# Patient Record
Sex: Female | Born: 1945 | Race: White | Hispanic: No | Marital: Married | Smoking: Never smoker
Health system: Southern US, Community
[De-identification: ages and names within clinical notes are randomized; demographics above are authoritative.]

## PROBLEM LIST (undated history)

## (undated) DIAGNOSIS — I10 Essential (primary) hypertension: Secondary | ICD-10-CM

## (undated) HISTORY — PX: BREAST EXCISIONAL BIOPSY: SUR124

---

## 1998-06-23 ENCOUNTER — Other Ambulatory Visit: Admission: RE | Admit: 1998-06-23 | Discharge: 1998-06-23 | Payer: Self-pay | Admitting: Obstetrics and Gynecology

## 1998-07-01 ENCOUNTER — Other Ambulatory Visit: Admission: RE | Admit: 1998-07-01 | Discharge: 1998-07-01 | Payer: Self-pay | Admitting: Radiology

## 1999-07-12 ENCOUNTER — Other Ambulatory Visit: Admission: RE | Admit: 1999-07-12 | Discharge: 1999-07-12 | Payer: Self-pay | Admitting: Obstetrics and Gynecology

## 1999-11-22 ENCOUNTER — Ambulatory Visit (HOSPITAL_COMMUNITY): Admission: RE | Admit: 1999-11-22 | Discharge: 1999-11-22 | Payer: Self-pay | Admitting: Obstetrics and Gynecology

## 1999-11-22 ENCOUNTER — Encounter: Payer: Self-pay | Admitting: Obstetrics and Gynecology

## 2000-08-23 ENCOUNTER — Other Ambulatory Visit: Admission: RE | Admit: 2000-08-23 | Discharge: 2000-08-23 | Payer: Self-pay | Admitting: Unknown Physician Specialty

## 2000-12-20 ENCOUNTER — Ambulatory Visit (HOSPITAL_COMMUNITY): Admission: RE | Admit: 2000-12-20 | Discharge: 2000-12-20 | Payer: Self-pay | Admitting: Obstetrics and Gynecology

## 2000-12-20 ENCOUNTER — Encounter: Payer: Self-pay | Admitting: Obstetrics and Gynecology

## 2002-07-08 ENCOUNTER — Ambulatory Visit (HOSPITAL_COMMUNITY): Admission: RE | Admit: 2002-07-08 | Discharge: 2002-07-08 | Payer: Self-pay | Admitting: Obstetrics and Gynecology

## 2002-07-08 ENCOUNTER — Encounter: Payer: Self-pay | Admitting: Obstetrics and Gynecology

## 2003-07-14 ENCOUNTER — Ambulatory Visit (HOSPITAL_COMMUNITY): Admission: RE | Admit: 2003-07-14 | Discharge: 2003-07-14 | Payer: Self-pay | Admitting: Obstetrics and Gynecology

## 2003-09-09 ENCOUNTER — Other Ambulatory Visit: Admission: RE | Admit: 2003-09-09 | Discharge: 2003-09-09 | Payer: Self-pay | Admitting: Obstetrics and Gynecology

## 2003-11-26 ENCOUNTER — Encounter: Admission: RE | Admit: 2003-11-26 | Discharge: 2003-11-26 | Payer: Self-pay | Admitting: Family Medicine

## 2004-07-26 ENCOUNTER — Ambulatory Visit (HOSPITAL_COMMUNITY): Admission: RE | Admit: 2004-07-26 | Discharge: 2004-07-26 | Payer: Self-pay | Admitting: Obstetrics and Gynecology

## 2004-09-22 ENCOUNTER — Other Ambulatory Visit: Admission: RE | Admit: 2004-09-22 | Discharge: 2004-09-22 | Payer: Self-pay | Admitting: Obstetrics and Gynecology

## 2005-08-17 ENCOUNTER — Ambulatory Visit (HOSPITAL_COMMUNITY): Admission: RE | Admit: 2005-08-17 | Discharge: 2005-08-17 | Payer: Self-pay | Admitting: Obstetrics and Gynecology

## 2006-02-01 ENCOUNTER — Other Ambulatory Visit: Admission: RE | Admit: 2006-02-01 | Discharge: 2006-02-01 | Payer: Self-pay | Admitting: Obstetrics and Gynecology

## 2006-02-06 ENCOUNTER — Encounter: Admission: RE | Admit: 2006-02-06 | Discharge: 2006-02-06 | Payer: Self-pay | Admitting: Family Medicine

## 2006-04-16 ENCOUNTER — Ambulatory Visit (HOSPITAL_COMMUNITY): Admission: RE | Admit: 2006-04-16 | Discharge: 2006-04-16 | Payer: Self-pay | Admitting: Obstetrics and Gynecology

## 2006-10-24 ENCOUNTER — Ambulatory Visit (HOSPITAL_COMMUNITY): Admission: RE | Admit: 2006-10-24 | Discharge: 2006-10-24 | Payer: Self-pay | Admitting: Obstetrics and Gynecology

## 2007-07-04 ENCOUNTER — Other Ambulatory Visit: Admission: RE | Admit: 2007-07-04 | Discharge: 2007-07-04 | Payer: Self-pay | Admitting: Obstetrics and Gynecology

## 2007-11-21 ENCOUNTER — Ambulatory Visit (HOSPITAL_COMMUNITY): Admission: RE | Admit: 2007-11-21 | Discharge: 2007-11-21 | Payer: Self-pay | Admitting: Obstetrics and Gynecology

## 2008-07-06 ENCOUNTER — Other Ambulatory Visit: Admission: RE | Admit: 2008-07-06 | Discharge: 2008-07-06 | Payer: Self-pay | Admitting: Obstetrics and Gynecology

## 2008-12-15 ENCOUNTER — Ambulatory Visit (HOSPITAL_COMMUNITY): Admission: RE | Admit: 2008-12-15 | Discharge: 2008-12-15 | Payer: Self-pay | Admitting: Obstetrics and Gynecology

## 2009-07-07 ENCOUNTER — Other Ambulatory Visit: Admission: RE | Admit: 2009-07-07 | Discharge: 2009-07-07 | Payer: Self-pay | Admitting: Obstetrics and Gynecology

## 2010-03-03 ENCOUNTER — Ambulatory Visit (HOSPITAL_COMMUNITY): Admission: RE | Admit: 2010-03-03 | Discharge: 2010-03-03 | Payer: Self-pay | Admitting: Obstetrics and Gynecology

## 2010-08-16 ENCOUNTER — Other Ambulatory Visit: Payer: Self-pay | Admitting: Obstetrics and Gynecology

## 2010-08-16 ENCOUNTER — Other Ambulatory Visit (HOSPITAL_COMMUNITY)
Admission: RE | Admit: 2010-08-16 | Discharge: 2010-08-16 | Disposition: A | Discharge status: Home / Self Care | Payer: BC Managed Care – PPO | Source: Ambulatory Visit | Attending: Obstetrics and Gynecology | Admitting: Obstetrics and Gynecology

## 2010-08-16 DIAGNOSIS — Z01419 Encounter for gynecological examination (general) (routine) without abnormal findings: Secondary | ICD-10-CM | POA: Insufficient documentation

## 2011-03-10 ENCOUNTER — Other Ambulatory Visit: Payer: Self-pay | Admitting: Family Medicine

## 2011-03-17 ENCOUNTER — Ambulatory Visit
Admission: RE | Admit: 2011-03-17 | Discharge: 2011-03-17 | Disposition: A | Discharge status: Home / Self Care | Payer: BC Managed Care – PPO | Source: Ambulatory Visit | Attending: Family Medicine | Admitting: Family Medicine

## 2011-05-17 ENCOUNTER — Other Ambulatory Visit (HOSPITAL_COMMUNITY): Payer: Self-pay | Admitting: Obstetrics and Gynecology

## 2011-05-17 DIAGNOSIS — Z1231 Encounter for screening mammogram for malignant neoplasm of breast: Secondary | ICD-10-CM

## 2011-06-14 ENCOUNTER — Ambulatory Visit (HOSPITAL_COMMUNITY)
Admission: RE | Admit: 2011-06-14 | Discharge: 2011-06-14 | Disposition: A | Discharge status: Home / Self Care | Payer: BC Managed Care – PPO | Source: Ambulatory Visit | Attending: Obstetrics and Gynecology | Admitting: Obstetrics and Gynecology

## 2011-06-14 DIAGNOSIS — Z1231 Encounter for screening mammogram for malignant neoplasm of breast: Secondary | ICD-10-CM | POA: Insufficient documentation

## 2011-08-21 ENCOUNTER — Other Ambulatory Visit (HOSPITAL_COMMUNITY)
Admission: RE | Admit: 2011-08-21 | Discharge: 2011-08-21 | Disposition: A | Discharge status: Home / Self Care | Payer: BC Managed Care – PPO | Source: Ambulatory Visit | Attending: Obstetrics and Gynecology | Admitting: Obstetrics and Gynecology

## 2011-08-21 ENCOUNTER — Other Ambulatory Visit: Payer: Self-pay | Admitting: Obstetrics and Gynecology

## 2011-08-21 DIAGNOSIS — Z01419 Encounter for gynecological examination (general) (routine) without abnormal findings: Secondary | ICD-10-CM | POA: Insufficient documentation

## 2012-08-20 ENCOUNTER — Other Ambulatory Visit (HOSPITAL_COMMUNITY)
Admission: RE | Admit: 2012-08-20 | Discharge: 2012-08-20 | Disposition: A | Discharge status: Home / Self Care | Payer: Medicare Other | Source: Ambulatory Visit | Attending: Obstetrics and Gynecology | Admitting: Obstetrics and Gynecology

## 2012-08-20 ENCOUNTER — Other Ambulatory Visit: Payer: Self-pay | Admitting: Obstetrics and Gynecology

## 2012-08-20 DIAGNOSIS — Z1151 Encounter for screening for human papillomavirus (HPV): Secondary | ICD-10-CM | POA: Insufficient documentation

## 2012-08-20 DIAGNOSIS — Z01419 Encounter for gynecological examination (general) (routine) without abnormal findings: Secondary | ICD-10-CM | POA: Insufficient documentation

## 2012-10-22 ENCOUNTER — Other Ambulatory Visit (HOSPITAL_COMMUNITY): Payer: Self-pay | Admitting: Family Medicine

## 2012-10-22 DIAGNOSIS — Z1231 Encounter for screening mammogram for malignant neoplasm of breast: Secondary | ICD-10-CM

## 2012-10-24 ENCOUNTER — Ambulatory Visit (HOSPITAL_COMMUNITY)
Admission: RE | Admit: 2012-10-24 | Discharge: 2012-10-24 | Disposition: A | Discharge status: Home / Self Care | Payer: Medicare Other | Source: Ambulatory Visit | Attending: Family Medicine | Admitting: Family Medicine

## 2012-10-24 DIAGNOSIS — Z1231 Encounter for screening mammogram for malignant neoplasm of breast: Secondary | ICD-10-CM

## 2013-08-27 ENCOUNTER — Other Ambulatory Visit: Payer: Self-pay | Admitting: Obstetrics and Gynecology

## 2013-08-27 ENCOUNTER — Other Ambulatory Visit (HOSPITAL_COMMUNITY)
Admission: RE | Admit: 2013-08-27 | Discharge: 2013-08-27 | Disposition: A | Discharge status: Home / Self Care | Payer: Medicare Other | Source: Ambulatory Visit | Attending: Obstetrics and Gynecology | Admitting: Obstetrics and Gynecology

## 2013-08-27 DIAGNOSIS — Z124 Encounter for screening for malignant neoplasm of cervix: Secondary | ICD-10-CM | POA: Insufficient documentation

## 2013-11-24 ENCOUNTER — Other Ambulatory Visit (HOSPITAL_COMMUNITY): Payer: Self-pay | Admitting: Obstetrics and Gynecology

## 2013-11-24 DIAGNOSIS — Z1231 Encounter for screening mammogram for malignant neoplasm of breast: Secondary | ICD-10-CM

## 2013-12-09 ENCOUNTER — Ambulatory Visit (HOSPITAL_COMMUNITY)
Admission: RE | Admit: 2013-12-09 | Discharge: 2013-12-09 | Disposition: A | Discharge status: Home / Self Care | Payer: Medicare Other | Source: Ambulatory Visit | Attending: Obstetrics and Gynecology | Admitting: Obstetrics and Gynecology

## 2013-12-09 DIAGNOSIS — Z1231 Encounter for screening mammogram for malignant neoplasm of breast: Secondary | ICD-10-CM | POA: Diagnosis not present

## 2014-10-08 ENCOUNTER — Encounter: Payer: Self-pay | Admitting: Family Medicine

## 2014-10-08 ENCOUNTER — Ambulatory Visit (INDEPENDENT_AMBULATORY_CARE_PROVIDER_SITE_OTHER)
Admission: RE | Admit: 2014-10-08 | Discharge: 2014-10-08 | Disposition: A | Discharge status: Home / Self Care | Payer: Medicare Other | Source: Ambulatory Visit | Attending: Family Medicine | Admitting: Family Medicine

## 2014-10-08 ENCOUNTER — Ambulatory Visit (INDEPENDENT_AMBULATORY_CARE_PROVIDER_SITE_OTHER): Payer: Medicare Other | Admitting: Family Medicine

## 2014-10-08 ENCOUNTER — Other Ambulatory Visit (INDEPENDENT_AMBULATORY_CARE_PROVIDER_SITE_OTHER): Payer: Medicare Other

## 2014-10-08 VITALS — BP 118/70 | HR 64 | Wt 127.0 lb

## 2014-10-08 DIAGNOSIS — M25531 Pain in right wrist: Secondary | ICD-10-CM | POA: Diagnosis not present

## 2014-10-08 DIAGNOSIS — M654 Radial styloid tenosynovitis [de Quervain]: Secondary | ICD-10-CM | POA: Diagnosis not present

## 2014-10-08 NOTE — Assessment & Plan Note (Signed)
Patient was given an injection today. Patient will do immobilization for the next week. We discussed icing and patient given a topical anti-inflammatory. Patient will come back and see me again in 3 weeks for further evaluation and treatment. Topical anti-inflammatory's given. X-rays ordered today as well to rule out any bony abnormality.

## 2014-10-08 NOTE — Progress Notes (Signed)
Pre visit review using our clinic review tool, if applicable. No additional management support is needed unless otherwise documented below in the visit note. 

## 2014-10-08 NOTE — Progress Notes (Signed)
Tawana ScaleZach Smith D.O. [facility] [address] [place], [place] [address] Phone: [phone] Subjective:    CC: Wrist pain  QMV:HQIONGEXBMHPI:Subjective Natasha Hoover is a 69 y.o. female coming in with complaint of wrist pain right wrist pain. Patient has had this pain since September of last year. Patient states that she has been in a brace as well as been given meloxicam 3 different times with only very minimal benefit. Patient states that it seems to be getting worse over the course of time. Patient is not on a physical therapy. Denies any numbness or tingling. Patient states that the pain is about it can even wake her up at night. Stopping her from daily activities such as opening a can. Seems to be localized over the right thumb. Sometimes she can notice some swelling. Rates the severity of pain is 8 out of 10 when it occurs.  No past medical history on file. No past surgical history on file. History  Substance Use Topics  . Smoking status: Never Smoker   . Smokeless tobacco: Not on file  . Alcohol Use: Not on file   Not on File No family history on file.      Past medical history, social, surgical and family history all reviewed in electronic medical record.   Review of Systems: No headache, visual changes, nausea, vomiting, diarrhea, constipation, dizziness, abdominal pain, skin rash, fevers, chills, night sweats, weight loss, swollen lymph nodes, body aches, joint swelling, muscle aches, chest pain, shortness of breath, mood changes.   Objective Blood pressure 118/70, pulse 64, weight 127 lb (57.607 kg), SpO2 99 %.  General: No apparent distress alert and oriented x3 mood and affect normal, dressed appropriately.  HEENT: Pupils equal, extraocular movements intact  Respiratory: Patient's speak in full sentences and does not appear short of breath  Cardiovascular: No lower extremity edema, non tender, no erythema  Skin: Warm dry intact with no signs of infection or rash on  extremities or on axial skeleton.  Abdomen: Soft nontender  Neuro: Cranial nerves II through XII are intact, neurovascularly intact in all extremities with 2+ DTRs and 2+ pulses.  Lymph: No lymphadenopathy of posterior or anterior cervical chain or axillae bilaterally.  Gait normal with good balance and coordination.  MSK:  Non tender with full range of motion and good stability and symmetric strength and tone of shoulders, elbows, hip, knee and ankles bilaterally.  Wrist: Right Inspection reveals patient does have some swelling on the thenar side of the wrist. ROM smooth and normal with good flexion and extension and ulnar/radial deviation that is symmetrical with opposite wrist. Tender to palpation over the [facility], scaphoid, as well as abductor pollicis longus tendon. No snuffbox tenderness. No tenderness over Canal of Guyon. Mild weakness with abduction of the thumb Positive Finkelstein Negative Watson's test.   MSK US performed of: Right wrist This study was ordered, performed, and interpreted by Terrilee FilesZach Smith D.O.  Wrist: Significant swelling of the abductor pollicis longus tendon sheath with possible tear noted with increasing neovascularization. No effusion seen. TFCC intact. Scapholunate ligament intact. Carpal tunnel visualized and median nerve area normal, flexor tendons all normal in appearance without fraying, tears, or sheath effusions. Power doppler signal normal.  IMPRESSION:  De Quervain's tenosynovitis  Procedure: Real-time Ultrasound Guided Injection of right abductor pollicis longus tendon sheath Device: GE Logiq E  Ultrasound guided injection is preferred based studies that show increased duration, increased effect, greater accuracy, decreased procedural pain, increased response rate, and decreased cost  with ultrasound guided versus blind injection.  Verbal informed consent obtained.  Time-out conducted.  Noted no overlying erythema, induration, or other signs of  local infection.  Skin prepped in a sterile fashion.  Local anesthesia: Topical Ethyl chloride.  With sterile technique and under real time ultrasound guidance:  With a 25-gauge 1 inch needle patient was injected with 0.5 mL of 0.5% Marcaine and 0.5 mL of Kenalog 40 mg/dL. Completed without difficulty  Pain immediately resolved suggesting accurate placement of the medication.  Advised to call if fevers/chills, erythema, induration, drainage, or persistent bleeding.  Images permanently stored and available for review in the ultrasound unit.  Impression: Technically successful ultrasound guided injection.  Procedure note 97110; 15 minutes spent for Therapeutic exercises as stated in above notes.  This included exercises focusing on stretching, strengthening, with significant focus on eccentric aspects. Patient given flexion and extension exercises. Patient given strengthening exercises and also range of motion exercises of the CMC joints and thumb.  Proper technique shown and discussed handout in great detail with ATC.  All questions were discussed and answered.     Impression and Recommendations:     This case required medical decision making of moderate complexity.

## 2014-10-08 NOTE — Patient Instructions (Addendum)
Good to see you.  Ice 20 minutes 2 times daily. Usually after activity and before bed. Exercises 3 times a week.  pennsaid pinkie amount topically 2 times daily as needed.  Wear brace day and night for 1 weeks then nightly for 2 weeks. See me again in 3 weeks.

## 2014-10-28 ENCOUNTER — Ambulatory Visit: Payer: Medicare Other | Admitting: Family Medicine

## 2015-02-08 ENCOUNTER — Other Ambulatory Visit: Payer: Self-pay

## 2015-02-08 DIAGNOSIS — Z1231 Encounter for screening mammogram for malignant neoplasm of breast: Secondary | ICD-10-CM

## 2015-02-25 ENCOUNTER — Ambulatory Visit
Admission: RE | Admit: 2015-02-25 | Discharge: 2015-02-25 | Disposition: A | Discharge status: Home / Self Care | Payer: Medicare Other | Source: Ambulatory Visit

## 2015-02-25 DIAGNOSIS — Z1231 Encounter for screening mammogram for malignant neoplasm of breast: Secondary | ICD-10-CM

## 2015-10-13 ENCOUNTER — Encounter: Payer: Self-pay | Admitting: Family Medicine

## 2015-10-13 ENCOUNTER — Other Ambulatory Visit: Payer: Self-pay

## 2015-10-13 ENCOUNTER — Ambulatory Visit (INDEPENDENT_AMBULATORY_CARE_PROVIDER_SITE_OTHER): Payer: Medicare Other | Admitting: Family Medicine

## 2015-10-13 VITALS — BP 120/80 | HR 67 | Wt 129.0 lb

## 2015-10-13 DIAGNOSIS — M25531 Pain in right wrist: Secondary | ICD-10-CM

## 2015-10-13 DIAGNOSIS — M5030 Other cervical disc degeneration, unspecified cervical region: Secondary | ICD-10-CM

## 2015-10-13 DIAGNOSIS — M654 Radial styloid tenosynovitis [de Quervain]: Secondary | ICD-10-CM

## 2015-10-13 MED ORDER — GABAPENTIN 100 MG PO CAPS: 100.0000 mg | ORAL_CAPSULE | Freq: Every day | ORAL | Status: DC

## 2015-10-13 NOTE — Progress Notes (Signed)
Pre visit review using our clinic review tool, if applicable. No additional management support is needed unless otherwise documented below in the visit note. 

## 2015-10-13 NOTE — Progress Notes (Signed)
Natasha Hoover [address] [place], [place] [address] Phone: [phone] Subjective:    CC: Wrist pain f/u New-onset shoulder pain  BJY:NWGNFAOZHY Natasha Hoover is a 70 y.o. female coming in with complaint of wrist pain right wrist pain. Patient was last seen greater than a year ago. Did have an injection for de Quervain's tenosynovitis. Patient was do conservative therapy. Patient states overall she had been doing very well until the last several weeks. Having increasing tenderness again. Very similar to what it was prior to the injections. Patient states that certain things such as opening cans or torsion give her a severe pain. No pain when not moving. Not doing the exercises or bracing. No new injury.  Patient is also complaining of shoulder pain. Describes the pain as dull throbbing aching sensation of the shoulders bilaterally. Seems to be associated with neck pain. States that if she looks down for long amount of time she can feel some mild radiation going down both of her arms. Denies any weakness. States that it can wake her up at night. Has not tried any medications for it. Still able to do daily activities. Full range of motion of the shoulders. Rates the severity of pain when it occurs is 6 out of 10. Still seems to be intermittent.  No past medical history on file. No past surgical history on file. Social History  Substance Use Topics  . Smoking status: Never Smoker   . Smokeless tobacco: None  . Alcohol Use: None   Not on File No family history on file. No family history of rheumatological diseases      Past medical history, social, surgical and family history all reviewed in electronic medical record.   Review of Systems: No headache, visual changes, nausea, vomiting, diarrhea, constipation, dizziness, abdominal pain, skin rash, fevers, chills, night sweats, weight loss, swollen lymph nodes, body aches, joint swelling, muscle aches, chest  pain, shortness of breath, mood changes.   Objective Blood pressure 120/80, pulse 67, weight 129 lb (58.514 kg), SpO2 99 %.  General: No apparent distress alert and oriented x3 mood and affect normal, dressed appropriately.  HEENT: Pupils equal, extraocular movements intact  Respiratory: Patient's speak in full sentences and does not appear short of breath  Cardiovascular: No lower extremity edema, non tender, no erythema  Skin: Warm dry intact with no signs of infection or rash on extremities or on axial skeleton.  Abdomen: Soft nontender  Neuro: Cranial nerves II through XII are intact, neurovascularly intact in all extremities with 2+ DTRs and 2+ pulses.  Lymph: No lymphadenopathy of posterior or anterior cervical chain or axillae bilaterally.  Gait normal with good balance and coordination.  MSK:  Non tender with full range of motion and good stability and symmetric strength and tone of shoulders, elbows, hip, knee and ankles bilaterally.  Neck: Inspection unremarkable. No palpable stepoffs. Negative Spurling's maneuver. Decreased extension by 10 as well as only having 5 his side bending bilaterally with mild limitation in left-sided rotation. Grip strength and sensation normal in bilateral hands Strength good C4 to T1 distribution No sensory change to C4 to T1 Negative Hoffman sign bilaterally Reflexes normal Wrist: Right Inspection reveals patient does have some swelling on the thenar side of the wrist. ROM smooth and normal with good flexion and extension and ulnar/radial deviation that is symmetrical with opposite wrist. Tender to palpation over the [facility], scaphoid, as well as abductor pollicis longus tendon. No snuffbox tenderness. No tenderness over  Canal of [place]. Mild weakness with abduction of the thumb Positive Finkelstein Negative Watson's test. Contralateral wrist unremarkable   MSK US performed of: Right wrist This study was ordered, performed, and interpreted by  Terrilee FilesZach Artemus Romanoff D.O.  Wrist: Significant swelling of the abductor pollicis longus tendon sheath with increasing tear noted from previous exam  effusion of the tendon sheath also noted TFCC intact. Scapholunate ligament intact. Carpal tunnel visualized and median nerve area normal, flexor tendons all normal in appearance without fraying, tears, or sheath effusions. Very mild underlying degenerative changes noted Power doppler signal normal.  IMPRESSION:  De Quervain's tenosynovitis with intersubstance tearing  Procedure: Real-time Ultrasound Guided Injection of right abductor pollicis longus tendon sheath Device: GE Logiq E  Ultrasound guided injection is preferred based studies that show increased duration, increased effect, greater accuracy, decreased procedural pain, increased response rate, and decreased cost with ultrasound guided versus blind injection.  Verbal informed consent obtained.  Time-out conducted.  Noted no overlying erythema, induration, or other signs of local infection.  Skin prepped in a sterile fashion.  Local anesthesia: Topical Ethyl chloride.  With sterile technique and under real time ultrasound guidance:  With a 25-gauge 1 inch needle patient was injected with 0.5 mL of 0.5% Marcaine and 0.5 mL of Kenalog 40 mg/dL. Completed without difficulty  Pain immediately resolved suggesting accurate placement of the medication.  Advised to call if fevers/chills, erythema, induration, drainage, or persistent bleeding.  Images permanently stored and available for review in the ultrasound unit.  Impression: Technically successful ultrasound guided injection.  Procedure note 97110; 15 minutes spent for Therapeutic exercises as stated in above notes.  This included exercises focusing on stretching, strengthening, with significant focus on eccentric aspects. Patient given flexion and extension exercises. Patient given strengthening exercises and also range of motion exercises of the  CMC joints and thumb.  Proper technique shown and discussed handout in great detail with ATC.  All questions were discussed and answered.     Impression and Recommendations:     This case required medical decision making of moderate complexity.

## 2015-10-13 NOTE — Assessment & Plan Note (Signed)
Patient given another injection today. Patient will start wearing the brace on a more regular basis. Does have trial of topical anti-inflammatory's. We discussed icing regimen. Follow-up again with me in 4 weeks.

## 2015-10-13 NOTE — Patient Instructions (Addendum)
Great to see you  Gabapentin 100mg  at night for 3 nights then 200mg  thereafter On wall with heels, butt shoulder and head touching for a goal of 5 minutes daily  Exercises 3 times a week for the upper back .  Wear wrist brace nightly for 2 weeks.  See me again in 4 weeks if neck is not a lot better Have a great time traveling!

## 2015-11-17 ENCOUNTER — Encounter: Payer: Self-pay | Admitting: Family Medicine

## 2015-11-17 ENCOUNTER — Ambulatory Visit (INDEPENDENT_AMBULATORY_CARE_PROVIDER_SITE_OTHER): Payer: Medicare Other | Admitting: Family Medicine

## 2015-11-17 VITALS — BP 122/82 | HR 72 | Wt 130.0 lb

## 2015-11-17 DIAGNOSIS — M654 Radial styloid tenosynovitis [de Quervain]: Secondary | ICD-10-CM

## 2015-11-17 DIAGNOSIS — M7550 Bursitis of unspecified shoulder: Secondary | ICD-10-CM | POA: Insufficient documentation

## 2015-11-17 DIAGNOSIS — M5030 Other cervical disc degeneration, unspecified cervical region: Secondary | ICD-10-CM

## 2015-11-17 DIAGNOSIS — M7551 Bursitis of right shoulder: Secondary | ICD-10-CM

## 2015-11-17 NOTE — Assessment & Plan Note (Signed)
Very mild signs of shoulder bursitis. If continuing to give her any difficulty I would consider a diagnostic as well as hopefully therapeutic injection of the shoulder. Encourage her to continue the home exercises which she has. Encourage her to Concentrate on posture

## 2015-11-17 NOTE — Patient Instructions (Signed)
You are doing well.  Maybe consider the brace at night.  The tendon will likely heal but does look like it will be slow Can repeat injection every 3 months.  The neck looks good but avoid excessive extension.  Continue with the exercises 2-3 times a week For the shoulder ice before bed.   Do not lift anything outside your peripheral vision  If shoulder is not better see me again in 2 months.  Otherwise see me when you need me.

## 2015-11-17 NOTE — Assessment & Plan Note (Signed)
Degenerative disc disease noted. Patient will hold on any x-rays but if continuing have pain I would like to consider this. Patient continue on the low dose gabapentin and we'll see if patient can come off of it in the future.

## 2015-11-17 NOTE — Progress Notes (Signed)
Tawana ScaleZach Smith D.O.  [facility] [address] [place], [place] [address] Phone: [phone] Subjective:    CC: Wrist pain f/u Follow-up neck and shoulder pain  BJY:NWGNFAOZHYHPI:Subjective Natasha EvertsYvonne Hoover is a 70 y.o. female coming in with complaint of wrist pain right wrist pain. Had de Quervain's tenosynovitis as well as an intersubstance tearing of the tendon. Patient has been wearing the brace at night. States that since injection approximate 70% better. States that if she does any heavy lifting she still notices it. Otherwise has noticed improvement..  Patient previously was complaining more shoulder pain likely had more of a cervical degenerative disc. Patient states that the gabapentin has been helpful. States that it is not as severe. Helping her sleep at night but some mild grogginess. Only doing 100 mg. No worsening of the symptoms.  Complains of some mild discomfort of the shoulder itself. Right-sided. Only certain activities. Can wake her up at night. States though that it seems to be few and far between. Not stopping her from daily activities.  No past medical history on file. No past surgical history on file. Social History  Substance Use Topics  . Smoking status: Never Smoker   . Smokeless tobacco: None  . Alcohol Use: None   Not on File No family history on file. No family history of rheumatological diseases      Past medical history, social, surgical and family history all reviewed in electronic medical record.   Review of Systems: No headache, visual changes, nausea, vomiting, diarrhea, constipation, dizziness, abdominal pain, skin rash, fevers, chills, night sweats, weight loss, swollen lymph nodes, body aches, joint swelling, muscle aches, chest pain, shortness of breath, mood changes.   Objective Blood pressure 122/82, pulse 72, weight 130 lb (58.968 kg).  General: No apparent distress alert and oriented x3 mood and affect normal, dressed appropriately.  HEENT:  Pupils equal, extraocular movements intact  Respiratory: Patient's speak in full sentences and does not appear short of breath  Cardiovascular: No lower extremity edema, non tender, no erythema  Skin: Warm dry intact with no signs of infection or rash on extremities or on axial skeleton.  Abdomen: Soft nontender  Neuro: Cranial nerves II through XII are intact, neurovascularly intact in all extremities with 2+ DTRs and 2+ pulses.  Lymph: No lymphadenopathy of posterior or anterior cervical chain or axillae bilaterally.  Gait normal with good balance and coordination.  MSK:  Non tender with full range of motion and good stability and symmetric strength and tone of  elbows, hip, knee and ankles bilaterally.  Neck: Inspection unremarkable. No palpable stepoffs. Negative Spurling's maneuver. Decreased extension by 10 as well as only having 5 his side bending bilaterally with mild limitation in left-sided rotation. Grip strength and sensation normal in bilateral hands Strength good C4 to T1 distribution No sensory change to C4 to T1 Negative Hoffman sign bilaterally Reflexes normal Seems stable. Shoulder: Right Inspection reveals no abnormalities, atrophy or asymmetry. Palpation is normal with no tenderness over AC joint or bicipital groove. ROM is full in all planes. Rotator cuff strength normal throughout. Mild impingement with positive Hawkins Speeds and Yergason's tests normal. No labral pathology noted with negative Obrien's, negative clunk and good stability. Normal scapular function observed. No painful arc and no drop arm sign. No apprehension sign Contralateral shoulder unremarkable  Wrist: Right Inspection reveals patient does have some swelling on the thenar side of the wrist. ROM smooth and normal with good flexion and extension and ulnar/radial deviation that is  symmetrical with opposite wrist. Mild tenderness over the [facility] but improved. No snuffbox tenderness. No  tenderness over Canal of Guyon. Mild weakness with abduction of the thumb still present but improved Finkelstein's which is improved Negative Watson's test. Contralateral wrist unremarkable    Impression and Recommendations:     This case required medical decision making of moderate complexity.

## 2015-11-17 NOTE — Assessment & Plan Note (Signed)
Doing well at this time. We'll continue to monitor closely. Patient can have repeat injections every 3-4 months if necessary. Hopefully that this will now be necessary. Encourage her to continue to wear the brace.

## 2016-02-22 ENCOUNTER — Other Ambulatory Visit: Payer: Self-pay | Admitting: Family Medicine

## 2016-02-23 NOTE — Telephone Encounter (Signed)
Refill done.  

## 2016-03-01 ENCOUNTER — Other Ambulatory Visit: Payer: Self-pay | Admitting: Family Medicine

## 2016-03-01 DIAGNOSIS — Z1231 Encounter for screening mammogram for malignant neoplasm of breast: Secondary | ICD-10-CM

## 2016-03-22 ENCOUNTER — Ambulatory Visit
Admission: RE | Admit: 2016-03-22 | Discharge: 2016-03-22 | Disposition: A | Discharge status: Home / Self Care | Payer: Medicare Other | Source: Ambulatory Visit | Attending: Family Medicine | Admitting: Family Medicine

## 2016-03-22 DIAGNOSIS — Z1231 Encounter for screening mammogram for malignant neoplasm of breast: Secondary | ICD-10-CM

## 2016-08-14 ENCOUNTER — Other Ambulatory Visit (HOSPITAL_COMMUNITY)
Admission: RE | Admit: 2016-08-14 | Discharge: 2016-08-14 | Disposition: A | Discharge status: Home / Self Care | Payer: Medicare Other | Source: Ambulatory Visit | Attending: Obstetrics and Gynecology | Admitting: Obstetrics and Gynecology

## 2016-08-14 ENCOUNTER — Other Ambulatory Visit: Payer: Self-pay | Admitting: Obstetrics and Gynecology

## 2016-08-14 DIAGNOSIS — Z01411 Encounter for gynecological examination (general) (routine) with abnormal findings: Secondary | ICD-10-CM | POA: Diagnosis present

## 2016-08-14 DIAGNOSIS — Z1151 Encounter for screening for human papillomavirus (HPV): Secondary | ICD-10-CM | POA: Insufficient documentation

## 2016-08-17 LAB — CYTOLOGY - PAP: HPV: NOT DETECTED

## 2016-09-27 ENCOUNTER — Other Ambulatory Visit: Payer: Self-pay | Admitting: Family Medicine

## 2016-09-27 ENCOUNTER — Ambulatory Visit (INDEPENDENT_AMBULATORY_CARE_PROVIDER_SITE_OTHER): Payer: Medicare Other | Admitting: Family Medicine

## 2016-09-27 ENCOUNTER — Ambulatory Visit: Payer: Self-pay

## 2016-09-27 ENCOUNTER — Encounter: Payer: Self-pay | Admitting: Family Medicine

## 2016-09-27 VITALS — BP 130/82 | HR 68 | Ht 66.0 in | Wt 126.0 lb

## 2016-09-27 DIAGNOSIS — M654 Radial styloid tenosynovitis [de Quervain]: Secondary | ICD-10-CM | POA: Diagnosis not present

## 2016-09-27 NOTE — Progress Notes (Signed)
Tawana ScaleZach Smith D.O. [facility] [address] [place], [place] [address] Phone: [phone] Subjective:      CC: Right wrist pain  HQI:ONGEXBMWUXHPI:Subjective  Natasha EvertsYvonne Hoover is a 71 y.o. female coming in with complaint of right wrist pain. Patient was previously seen one year ago and found to have more of a de Quervain's tenosynovitis. Elected to have injection. Patient was to do home exercises and bracing. Patient states Since then she was doing better. Patient states though now sent have worsening pain again. Patient didn't help her granddaughter move out of the dorm. Sentences and has had more pain. Has been trying to wear the brace with no significant benefit. Waking her up at night and affecting daily activities.     No past medical history on file. No past surgical history on file. Social History   Social History  . Marital status: Married    Spouse name: N/A  . Number of children: N/A  . Years of education: N/A   Social History Main Topics  . Smoking status: Never Smoker  . Smokeless tobacco: Never Used  . Alcohol use None  . Drug use: Unknown  . Sexual activity: Not Asked   Other Topics Concern  . None   Social History Narrative  . None   Not on File No family history on file.  Past medical history, social, surgical and family history all reviewed in electronic medical record.  No pertanent information unless stated regarding to the chief complaint.   Review of Systems:Review of systems updated and as accurate as of 09/27/16  No headache, visual changes, nausea, vomiting, diarrhea, constipation, dizziness, abdominal pain, skin rash, fevers, chills, night sweats, weight loss, swollen lymph nodes, body aches, joint swelling, muscle aches, chest pain, shortness of breath, mood changes.   Objective  Blood pressure 130/82, pulse 68, height 5\' 6"  (1.676 m), weight 126 lb (57.2 kg), SpO2 98 %.  Systems examined below as of 09/27/16 General: NAD A&O x3 mood, affect normal   HEENT: Pupils equal, extraocular movements intact no nystagmus Respiratory: not short of breath at rest or with speaking Cardiovascular: No lower extremity edema, non tender Skin: Warm dry intact with no signs of infection or rash on extremities or on axial skeleton. Abdomen: Soft nontender, no masses Neuro: Cranial nerves  intact, neurovascularly intact in all extremities with 2+ DTRs and 2+ pulses. Lymph: No lymphadenopathy appreciated today  Gait normal with good balance and coordination.  MSK: Non tender with full range of motion and good stability and symmetric strength and tone of shoulders, elbows, ,  knee hips and ankles bilaterally.   Wrist: Right Inspection normal with no visible erythema or swelling. ROM smooth and normal with good flexion and extension and ulnar/radial deviation that is symmetrical with opposite wrist. Palpation is normal over metacarpals, navicular, lunate, and TFCC; tendons without tenderness/ swelling No snuffbox tenderness. No tenderness over Canal of Gu[place]. Strength 5/5 in all directions without pain. Positive Finkelstein, negative tinel's and phalens. Negative Watson's test.  Procedure: Real-time Ultrasound Guided Injection of right Abductor pollicis longs tendon sheath Device: GE Logiq E  Ultrasound guided injection is preferred based studies that show increased duration, increased effect, greater accuracy, decreased procedural pain, increased response rate with ultrasound guided versus blind injection.  Verbal informed consent obtained.  Time-out conducted.  Noted no overlying erythema, induration, or other signs of local infection.  Skin prepped in a sterile fashion.  Local anesthesia: Topical Ethyl chloride.  With sterile technique and under  real time ultrasound guidance:  tendon visualized.  23g 5/8 inch needle inserted distal to proximal approach into tendon sheath. Pictures taken  for needle placement. Patient did have injection of 0.5 cc of 0.5%  Marcaine, and 0.5 cc of Kenalog 40 mg/dL. Completed without difficulty  Pain immediately resolved suggesting accurate placement of the medication.  Advised to call if fevers/chills, erythema, induration, drainage, or persistent bleeding.  Images permanently stored and available for review in the ultrasound unit.  Impression: Technically successful ultrasound guided injection.    Impression and Recommendations:     This case required medical decision making of moderate complexity.      Note: This dictation was prepared with Dragon dictation along with smaller phrase technology. Any transcriptional errors that result from this process are unintentional.

## 2016-09-27 NOTE — Assessment & Plan Note (Signed)
Patient was given an injection. Tolerated the procedure well. We discussed icing regimen and home exercises. We discussed which activities in which ones to avoid. Encourage patient to increase activity as tolerated. Short course of the brace. Follow-up again as needed as long as patient does well.

## 2016-09-27 NOTE — Patient Instructions (Signed)
Good to see you  You know the drill.  If I have to do this annually not a big deal.  Start the exercises again 3 times a week  Brace day and night for 1 week then nightly for 2 weeks.  See me again in 4 weeks if not better or otherwise as needed.

## 2017-03-14 ENCOUNTER — Other Ambulatory Visit: Payer: Self-pay | Admitting: Family Medicine

## 2017-03-14 DIAGNOSIS — Z1231 Encounter for screening mammogram for malignant neoplasm of breast: Secondary | ICD-10-CM

## 2017-04-12 ENCOUNTER — Ambulatory Visit
Admission: RE | Admit: 2017-04-12 | Discharge: 2017-04-12 | Disposition: A | Discharge status: Home / Self Care | Payer: Medicare Other | Source: Ambulatory Visit | Attending: Family Medicine | Admitting: Family Medicine

## 2017-04-12 DIAGNOSIS — Z1231 Encounter for screening mammogram for malignant neoplasm of breast: Secondary | ICD-10-CM

## 2017-07-04 NOTE — Progress Notes (Signed)
Tawana ScaleZach Aislinn Feliz D.O. [facility] [address] [place], [place] [address] Phone: [phone] Subjective:     CC: Right wrist pain  BJY:NWGNFAOZHYHPI:Subjective  Raylene EvertsYvonne Hoover is a 72 y.o. female coming in with complaint of right wrist pain. Her pain started back in December and her wrist pain started then. She has been using ice and bracing at night.   Patient states it feels very similar to previous exam.    No past medical history on file. Past Surgical History:  Procedure Laterality Date  . BREAST EXCISIONAL BIOPSY Right    Social History   Socioeconomic History  . Marital status: Married    Spouse name: None  . Number of children: None  . Years of education: None  . Highest education level: None  Social Needs  . Financial resource strain: None  . Food insecurity - worry: None  . Food insecurity - inability: None  . Transportation needs - medical: None  . Transportation needs - non-medical: None  Occupational History  . None  Tobacco Use  . Smoking status: Never Smoker  . Smokeless tobacco: Never Used  Substance and Sexual Activity  . Alcohol use: None  . Drug use: None  . Sexual activity: None  Other Topics Concern  . None  Social History Narrative  . None   Not on File No family history on file.  No family history of autoimmune   Past medical history, social, surgical and family history all reviewed in electronic medical record.  No pertanent information unless stated regarding to the chief complaint.   Review of Systems:Review of systems updated and as accurate as of 07/05/17  No headache, visual changes, nausea, vomiting, diarrhea, constipation, dizziness, abdominal pain, skin rash, fevers, chills, night sweats, weight loss, swollen lymph nodes, body aches, joint swelling, muscle aches, chest pain, shortness of breath, mood changes.   Objective  Blood pressure 120/76, pulse 63, height 5\' 4"  (1.626 m), weight 126 lb (57.2 kg), SpO2 98 %. Systems examined  below as of 07/05/17   General: No apparent distress alert and oriented x3 mood and affect normal, dressed appropriately.  HEENT: Pupils equal, extraocular movements intact  Respiratory: Patient's speak in full sentences and does not appear short of breath  Cardiovascular: No lower extremity edema, non tender, no erythema  Skin: Warm dry intact with no signs of infection or rash on extremities or on axial skeleton.  Abdomen: Soft nontender  Neuro: Cranial nerves II through XII are intact, neurovascularly intact in all extremities with 2+ DTRs and 2+ pulses.  Lymph: No lymphadenopathy of posterior or anterior cervical chain or axillae bilaterally.  Gait normal with good balance and coordination.  MSK:  Non tender with full range of motion and good stability and symmetric strength and tone of shoulders, elbows,  hip, knee and ankles bilaterally.  Wrist: Right Inspection normal with no visible erythema or swelling. ROM smooth and normal with good flexion and extension and ulnar/radial deviation that is symmetrical with opposite wrist. Palpation is normal over metacarpals, navicular, lunate, and TFCC; tendons without tenderness/ swelling No snuffbox tenderness. No tenderness over Canal of Guyon. Strength 5/5 in all directions without pain. Positive Finkelstein, negative Tinel's and phalens. Negative Watson's test. Contralateral wrist unremarkable  Procedure: Real-time Ultrasound Guided Injection of right Abductor pollicis longs tendon sheath Device: GE Logiq E  Ultrasound guided injection is preferred based studies that show increased duration, increased effect, greater accuracy, decreased procedural pain, increased response rate with ultrasound guided versus blind  injection.  Verbal informed consent obtained.  Time-out conducted.  Noted no overlying erythema, induration, or other signs of local infection.  Skin prepped in a sterile fashion.  Local anesthesia: Topical Ethyl chloride.  With  sterile technique and under real time ultrasound guidance:  tendon visualized.  23g 5/8 inch needle inserted distal to proximal approach into tendon sheath. Pictures taken  for needle placement. Patient did have injection of 0.5 cc of 0.5% Marcaine, and 0.5 cc of Kenalog 40 mg/dL. Completed without difficulty  Pain immediately resolved suggesting accurate placement of the medication.  Advised to call if fevers/chills, erythema, induration, drainage, or persistent bleeding.  Images permanently stored and available for review in the ultrasound unit.  Impression: Technically successful ultrasound guided injection.   Impression and Recommendations:     This case required medical decision making of moderate complexity.      Note: This dictation was prepared with Dragon dictation along with smaller phrase technology. Any transcriptional errors that result from this process are unintentional.

## 2017-07-05 ENCOUNTER — Ambulatory Visit: Payer: Medicare Other | Admitting: Family Medicine

## 2017-07-05 ENCOUNTER — Ambulatory Visit: Payer: Self-pay

## 2017-07-05 ENCOUNTER — Encounter: Payer: Self-pay | Admitting: Family Medicine

## 2017-07-05 VITALS — BP 120/76 | HR 63 | Ht 64.0 in | Wt 126.0 lb

## 2017-07-05 DIAGNOSIS — M25531 Pain in right wrist: Secondary | ICD-10-CM

## 2017-07-05 DIAGNOSIS — M654 Radial styloid tenosynovitis [de Quervain]: Secondary | ICD-10-CM

## 2017-07-05 NOTE — Patient Instructions (Signed)
Good to see you  Ice is your friend Brace day and night for 1 week pennsaid pinkie amount topically 2 times daily as needed.   Brace maybe nightly for 2 weeks.  Exercises 3 times a week.  See me again in 6 weeks if not gone.

## 2017-07-05 NOTE — Assessment & Plan Note (Signed)
Repeat injection given today.  Tolerated the procedure well.  Discussed icing regimen and home exercises as well as bracing.  Topical anti-inflammatories given.  Follow-up again in 4-6 weeks

## 2018-01-28 NOTE — Progress Notes (Signed)
[name] [address] [place], [place] [address] Phone: [phone] Subjective:   Bruce Donath, am serving as a scribe for Dr. Antoine Primas.    CC: right wrist pain   BJY:NWGNFAOZHY  Natasha Hoover is a 72 y.o. female coming in with complaint of right wrist pain. She has been having right wrist pain that seems to be increasing over the past month. Pain is alleviated with wearing wrist brace. Feels like she does not have any strength. Is having an increase in thickness of the pollicus tendons.       History reviewed. No pertinent past medical history. Past Surgical History:  Procedure Laterality Date  . BREAST EXCISIONAL BIOPSY Right    Social History   Socioeconomic History  . Marital status: Married    Spouse name: Not on file  . Number of children: Not on file  . Years of education: Not on file  . Highest education level: Not on file  Occupational History  . Not on file  Social Needs  . Financial resource strain: Not on file  . Food insecurity:    Worry: Not on file    Inability: Not on file  . Transportation needs:    Medical: Not on file    Non-medical: Not on file  Tobacco Use  . Smoking status: Never Smoker  . Smokeless tobacco: Never Used  Substance and Sexual Activity  . Alcohol use: Not on file  . Drug use: Not on file  . Sexual activity: Not on file  Lifestyle  . Physical activity:    Days per week: Not on file    Minutes per session: Not on file  . Stress: Not on file  Relationships  . Social connections:    Talks on phone: Not on file    Gets together: Not on file    Attends religious service: Not on file    Active member of club or organization: Not on file    Attends meetings of clubs or organizations: Not on file    Relationship status: Not on file  Other Topics Concern  . Not on file  Social History Narrative  . Not on file   Not on File History reviewed. No pertinent family history.  No family  history of autoimmune   Current Outpatient Medications (Cardiovascular):  .  lisinopril (PRINIVIL,ZESTRIL) 5 MG tablet,       Past medical history, social, surgical and family history all reviewed in electronic medical record.  No pertanent information unless stated regarding to the chief complaint.   Review of Systems:  No headache, visual changes, nausea, vomiting, diarrhea, constipation, dizziness, abdominal pain, skin rash, fevers, chills, night sweats, weight loss, swollen lymph nodes, body aches, joint swelling, muscle aches, chest pain, shortness of breath, mood changes.   Objective  Blood pressure 112/82, pulse 73, height 5\' 4"  (1.626 m), weight 124 lb (56.2 kg), SpO2 96 %.   General: No apparent distress alert and oriented x3 mood and affect normal, dressed appropriately.  HEENT: Pupils equal, extraocular movements intact  Respiratory: Patient's speak in full sentences and does not appear short of breath  Cardiovascular: No lower extremity edema, non tender, no erythema  Skin: Warm dry intact with no signs of infection or rash on extremities or on axial skeleton.  Abdomen: Soft nontender  Neuro: Cranial nerves II through XII are intact, neurovascularly intact in all extremities with 2+ DTRs and 2+ pulses.  Lymph: No lymphadenopathy of posterior or  anterior cervical chain or axillae bilaterally.  Gait normal with good balance and coordination.  MSK:  Non tender with full range of motion and good stability and symmetric strength and tone of shoulders, elbows,  hip, knee and ankles bilaterally.  Wrist: right  Inspection normal with no visible erythema or swelling. ROM smooth and normal with good flexion and extension and ulnar/radial deviation that is symmetrical with opposite wrist. Palpation is normal over metacarpals, navicular, lunate, and TFCC; tendons without tenderness/ swelling No snuffbox tenderness. No tenderness over Canal of Guyon. Strength 5/5 in all directions  without pain. Positive Finkelstein, negative tinel's and phalens. Negative Watson's test. Contralateral wrist unremarkable  Procedure: Real-time Ultrasound Guided Injection of right Abductor pollicis longs tendon sheath Device: GE Logiq E  Ultrasound guided injection is preferred based studies that show increased duration, increased effect, greater accuracy, decreased procedural pain, increased response rate with ultrasound guided versus blind injection.  Verbal informed consent obtained.  Time-out conducted.  Noted no overlying erythema, induration, or other signs of local infection.  Skin prepped in a sterile fashion.  Local anesthesia: Topical Ethyl chloride.  With sterile technique and under real time ultrasound guidance:  tendon visualized.  23g 5/8 inch needle inserted distal to proximal approach into tendon sheath. Pictures taken  for needle placement. Patient did have injection of 0.5 cc of 0.5% Marcaine, and 0.5 cc of Kenalog 40 mg/dL. Completed without difficulty  Pain immediately resolved suggesting accurate placement of the medication.  Advised to call if fevers/chills, erythema, induration, drainage, or persistent bleeding.  Images permanently stored and available for review in the ultrasound unit.  Impression: Technically successful ultrasound guided injection.    Impression and Recommendations:     This case required medical decision making of moderate complexity. The above documentation has been reviewed and is accurate and complete Judi SaaZachary M Smith, DO       Note: This dictation was prepared with Dragon dictation along with smaller phrase technology. Any transcriptional errors that result from this process are unintentional.

## 2018-01-29 ENCOUNTER — Ambulatory Visit: Payer: Self-pay

## 2018-01-29 ENCOUNTER — Encounter: Payer: Self-pay | Admitting: Family Medicine

## 2018-01-29 ENCOUNTER — Ambulatory Visit: Payer: Medicare Other | Admitting: Family Medicine

## 2018-01-29 VITALS — BP 112/82 | HR 73 | Ht 64.0 in | Wt 124.0 lb

## 2018-01-29 DIAGNOSIS — M654 Radial styloid tenosynovitis [de Quervain]: Secondary | ICD-10-CM | POA: Diagnosis not present

## 2018-01-29 DIAGNOSIS — M25531 Pain in right wrist: Secondary | ICD-10-CM

## 2018-01-29 NOTE — Assessment & Plan Note (Signed)
Given an injection today.  We discussed the possibility of PRP.  Discussed icing stretching for exercise. Discussed bracing.  Worsening symptoms we will consider other possible treatment.  Follow-up again for 8 weeks

## 2018-01-29 NOTE — Patient Instructions (Signed)
Good to see you  Ice is yoru friend Wear brace day and night or week  pennsaid pinkie amount topically 2 times daily as needed.  Exercises 3 times a week.  In a week  See me when you need me but read about PRP if it does not last 3 months

## 2018-04-12 ENCOUNTER — Other Ambulatory Visit: Payer: Self-pay | Admitting: Family Medicine

## 2018-04-12 DIAGNOSIS — Z1231 Encounter for screening mammogram for malignant neoplasm of breast: Secondary | ICD-10-CM

## 2018-05-22 ENCOUNTER — Ambulatory Visit: Payer: Medicare Other

## 2018-06-25 ENCOUNTER — Ambulatory Visit
Admission: RE | Admit: 2018-06-25 | Discharge: 2018-06-25 | Disposition: A | Discharge status: Home / Self Care | Payer: Medicare Other | Source: Ambulatory Visit | Attending: Family Medicine | Admitting: Family Medicine

## 2018-06-25 DIAGNOSIS — Z1231 Encounter for screening mammogram for malignant neoplasm of breast: Secondary | ICD-10-CM

## 2019-07-07 ENCOUNTER — Ambulatory Visit: Payer: Medicare PPO | Attending: Internal Medicine

## 2019-07-07 DIAGNOSIS — Z23 Encounter for immunization: Secondary | ICD-10-CM | POA: Insufficient documentation

## 2019-07-07 NOTE — Progress Notes (Signed)
   Covid-19 Vaccination Clinic  Name:  Natasha Hoover    MRN: [id number] DOB: 22-Jun-1945  07/07/2019  Natasha Hoover was observed post Covid-19 immunization for 15 minutes without incidence. She was provided with Vaccine Information Sheet and instruction to access the V-Safe system.   Natasha Hoover was instructed to call 911 with any severe reactions post vaccine: Marland Kitchen Difficulty breathing  . Swelling of your face and throat  . A fast heartbeat  . A bad rash all over your body  . Dizziness and weakness    Immunizations Administered    Name Date Dose VIS Date Route   Pfizer COVID-19 Vaccine 07/07/2019 11:36 AM 0.3 mL 04/18/2019 Intramuscular   Manufacturer: [employer], [employer]   Lot: [id number]   NDC: [address]-6

## 2019-07-29 ENCOUNTER — Other Ambulatory Visit: Payer: Self-pay | Admitting: Family Medicine

## 2019-07-29 DIAGNOSIS — Z1231 Encounter for screening mammogram for malignant neoplasm of breast: Secondary | ICD-10-CM

## 2019-08-05 ENCOUNTER — Ambulatory Visit: Payer: Medicare PPO | Attending: Internal Medicine

## 2019-08-05 DIAGNOSIS — Z23 Encounter for immunization: Secondary | ICD-10-CM

## 2019-08-05 NOTE — Progress Notes (Signed)
   Covid-19 Vaccination Clinic  Name:  Natasha Hoover    MRN: [id number] DOB: 11-Apr-1946  08/05/2019  [name] was observed post Covid-19 immunization for 15 minutes without incident. She was provided with Vaccine Information Sheet and instruction to access the V-Safe system.   [name] was instructed to call 911 with any severe reactions post vaccine: Marland Kitchen Difficulty breathing  . Swelling of face and throat  . A fast heartbeat  . A bad rash all over body  . Dizziness and weakness   Immunizations Administered    Name Date Dose VIS Date Route   Pfizer COVID-19 Vaccine 08/05/2019  3:31 PM 0.3 mL 04/18/2019 Intramuscular   Manufacturer: [employer], [employer]   Lot: [id number]   NDC: [address]-3

## 2019-09-17 ENCOUNTER — Other Ambulatory Visit: Payer: Self-pay

## 2019-09-17 ENCOUNTER — Ambulatory Visit
Admission: RE | Admit: 2019-09-17 | Discharge: 2019-09-17 | Disposition: A | Discharge status: Home / Self Care | Payer: Medicare PPO | Source: Ambulatory Visit | Attending: Family Medicine | Admitting: Family Medicine

## 2019-09-17 DIAGNOSIS — Z1231 Encounter for screening mammogram for malignant neoplasm of breast: Secondary | ICD-10-CM | POA: Diagnosis not present

## 2019-10-29 DIAGNOSIS — L821 Other seborrheic keratosis: Secondary | ICD-10-CM | POA: Diagnosis not present

## 2019-10-29 DIAGNOSIS — D2271 Melanocytic nevi of right lower limb, including hip: Secondary | ICD-10-CM | POA: Diagnosis not present

## 2019-10-29 DIAGNOSIS — D2371 Other benign neoplasm of skin of right lower limb, including hip: Secondary | ICD-10-CM | POA: Diagnosis not present

## 2019-10-29 DIAGNOSIS — D1801 Hemangioma of skin and subcutaneous tissue: Secondary | ICD-10-CM | POA: Diagnosis not present

## 2019-10-29 DIAGNOSIS — I788 Other diseases of capillaries: Secondary | ICD-10-CM | POA: Diagnosis not present

## 2019-10-29 DIAGNOSIS — D225 Melanocytic nevi of trunk: Secondary | ICD-10-CM | POA: Diagnosis not present

## 2019-10-29 DIAGNOSIS — L738 Other specified follicular disorders: Secondary | ICD-10-CM | POA: Diagnosis not present

## 2019-11-19 DIAGNOSIS — E559 Vitamin D deficiency, unspecified: Secondary | ICD-10-CM | POA: Diagnosis not present

## 2019-11-19 DIAGNOSIS — R7303 Prediabetes: Secondary | ICD-10-CM | POA: Diagnosis not present

## 2019-11-19 DIAGNOSIS — R634 Abnormal weight loss: Secondary | ICD-10-CM | POA: Diagnosis not present

## 2019-11-19 DIAGNOSIS — M85851 Other specified disorders of bone density and structure, right thigh: Secondary | ICD-10-CM | POA: Diagnosis not present

## 2019-11-19 DIAGNOSIS — I10 Essential (primary) hypertension: Secondary | ICD-10-CM | POA: Diagnosis not present

## 2020-01-01 DIAGNOSIS — H524 Presbyopia: Secondary | ICD-10-CM | POA: Diagnosis not present

## 2020-01-01 DIAGNOSIS — H5202 Hypermetropia, left eye: Secondary | ICD-10-CM | POA: Diagnosis not present

## 2020-01-01 DIAGNOSIS — H35033 Hypertensive retinopathy, bilateral: Secondary | ICD-10-CM | POA: Diagnosis not present

## 2020-01-01 DIAGNOSIS — H5211 Myopia, right eye: Secondary | ICD-10-CM | POA: Diagnosis not present

## 2020-01-01 DIAGNOSIS — H52223 Regular astigmatism, bilateral: Secondary | ICD-10-CM | POA: Diagnosis not present

## 2020-01-01 DIAGNOSIS — I10 Essential (primary) hypertension: Secondary | ICD-10-CM | POA: Diagnosis not present

## 2020-01-16 DIAGNOSIS — I788 Other diseases of capillaries: Secondary | ICD-10-CM | POA: Diagnosis not present

## 2020-01-16 DIAGNOSIS — L239 Allergic contact dermatitis, unspecified cause: Secondary | ICD-10-CM | POA: Diagnosis not present

## 2020-01-16 DIAGNOSIS — L720 Epidermal cyst: Secondary | ICD-10-CM | POA: Diagnosis not present

## 2020-01-16 DIAGNOSIS — L570 Actinic keratosis: Secondary | ICD-10-CM | POA: Diagnosis not present

## 2020-05-26 DIAGNOSIS — M85851 Other specified disorders of bone density and structure, right thigh: Secondary | ICD-10-CM | POA: Diagnosis not present

## 2020-05-26 DIAGNOSIS — Z0000 Encounter for general adult medical examination without abnormal findings: Secondary | ICD-10-CM | POA: Diagnosis not present

## 2020-05-26 DIAGNOSIS — I10 Essential (primary) hypertension: Secondary | ICD-10-CM | POA: Diagnosis not present

## 2020-05-26 DIAGNOSIS — R7303 Prediabetes: Secondary | ICD-10-CM | POA: Diagnosis not present

## 2020-05-26 DIAGNOSIS — E2839 Other primary ovarian failure: Secondary | ICD-10-CM | POA: Diagnosis not present

## 2020-05-26 DIAGNOSIS — Z1211 Encounter for screening for malignant neoplasm of colon: Secondary | ICD-10-CM | POA: Diagnosis not present

## 2020-05-26 DIAGNOSIS — Z1231 Encounter for screening mammogram for malignant neoplasm of breast: Secondary | ICD-10-CM | POA: Diagnosis not present

## 2020-05-26 DIAGNOSIS — E782 Mixed hyperlipidemia: Secondary | ICD-10-CM | POA: Diagnosis not present

## 2020-05-27 ENCOUNTER — Other Ambulatory Visit: Payer: Self-pay | Admitting: Family Medicine

## 2020-05-27 DIAGNOSIS — E559 Vitamin D deficiency, unspecified: Secondary | ICD-10-CM

## 2020-05-27 DIAGNOSIS — Z1231 Encounter for screening mammogram for malignant neoplasm of breast: Secondary | ICD-10-CM

## 2020-05-27 DIAGNOSIS — M8580 Other specified disorders of bone density and structure, unspecified site: Secondary | ICD-10-CM

## 2020-05-27 DIAGNOSIS — E2839 Other primary ovarian failure: Secondary | ICD-10-CM

## 2020-06-08 DIAGNOSIS — T148XXA Other injury of unspecified body region, initial encounter: Secondary | ICD-10-CM | POA: Diagnosis not present

## 2020-11-04 ENCOUNTER — Other Ambulatory Visit: Payer: Self-pay | Admitting: Family Medicine

## 2020-11-04 DIAGNOSIS — Z1231 Encounter for screening mammogram for malignant neoplasm of breast: Secondary | ICD-10-CM

## 2020-11-07 DIAGNOSIS — R42 Dizziness and giddiness: Secondary | ICD-10-CM | POA: Diagnosis not present

## 2020-11-07 DIAGNOSIS — R002 Palpitations: Secondary | ICD-10-CM | POA: Diagnosis not present

## 2020-11-24 DIAGNOSIS — R7303 Prediabetes: Secondary | ICD-10-CM | POA: Diagnosis not present

## 2020-11-24 DIAGNOSIS — Z1211 Encounter for screening for malignant neoplasm of colon: Secondary | ICD-10-CM | POA: Diagnosis not present

## 2020-11-24 DIAGNOSIS — E782 Mixed hyperlipidemia: Secondary | ICD-10-CM | POA: Diagnosis not present

## 2020-11-24 DIAGNOSIS — R42 Dizziness and giddiness: Secondary | ICD-10-CM | POA: Diagnosis not present

## 2020-11-24 DIAGNOSIS — I10 Essential (primary) hypertension: Secondary | ICD-10-CM | POA: Diagnosis not present

## 2020-12-29 ENCOUNTER — Other Ambulatory Visit: Payer: Self-pay

## 2020-12-29 ENCOUNTER — Ambulatory Visit
Admission: RE | Admit: 2020-12-29 | Discharge: 2020-12-29 | Disposition: A | Discharge status: Home / Self Care | Payer: Medicare PPO | Source: Ambulatory Visit

## 2020-12-29 DIAGNOSIS — Z1231 Encounter for screening mammogram for malignant neoplasm of breast: Secondary | ICD-10-CM | POA: Diagnosis not present

## 2021-01-04 DIAGNOSIS — I10 Essential (primary) hypertension: Secondary | ICD-10-CM | POA: Diagnosis not present

## 2021-01-04 DIAGNOSIS — H5211 Myopia, right eye: Secondary | ICD-10-CM | POA: Diagnosis not present

## 2021-01-04 DIAGNOSIS — H5202 Hypermetropia, left eye: Secondary | ICD-10-CM | POA: Diagnosis not present

## 2021-01-04 DIAGNOSIS — H52223 Regular astigmatism, bilateral: Secondary | ICD-10-CM | POA: Diagnosis not present

## 2021-01-04 DIAGNOSIS — H35033 Hypertensive retinopathy, bilateral: Secondary | ICD-10-CM | POA: Diagnosis not present

## 2021-01-04 DIAGNOSIS — H524 Presbyopia: Secondary | ICD-10-CM | POA: Diagnosis not present

## 2021-05-13 ENCOUNTER — Ambulatory Visit
Admission: RE | Admit: 2021-05-13 | Discharge: 2021-05-13 | Disposition: A | Discharge status: Home / Self Care | Payer: Medicare PPO | Source: Ambulatory Visit | Attending: Family Medicine | Admitting: Family Medicine

## 2021-05-13 DIAGNOSIS — E2839 Other primary ovarian failure: Secondary | ICD-10-CM

## 2021-05-13 DIAGNOSIS — M810 Age-related osteoporosis without current pathological fracture: Secondary | ICD-10-CM | POA: Diagnosis not present

## 2021-05-13 DIAGNOSIS — Z780 Asymptomatic menopausal state: Secondary | ICD-10-CM | POA: Diagnosis not present

## 2021-05-13 DIAGNOSIS — M8580 Other specified disorders of bone density and structure, unspecified site: Secondary | ICD-10-CM

## 2021-05-13 DIAGNOSIS — E559 Vitamin D deficiency, unspecified: Secondary | ICD-10-CM

## 2021-06-07 DIAGNOSIS — E782 Mixed hyperlipidemia: Secondary | ICD-10-CM | POA: Diagnosis not present

## 2021-06-07 DIAGNOSIS — M810 Age-related osteoporosis without current pathological fracture: Secondary | ICD-10-CM | POA: Diagnosis not present

## 2021-06-07 DIAGNOSIS — Z0000 Encounter for general adult medical examination without abnormal findings: Secondary | ICD-10-CM | POA: Diagnosis not present

## 2021-06-07 DIAGNOSIS — K219 Gastro-esophageal reflux disease without esophagitis: Secondary | ICD-10-CM | POA: Diagnosis not present

## 2021-06-07 DIAGNOSIS — R894 Abnormal immunological findings in specimens from other organs, systems and tissues: Secondary | ICD-10-CM | POA: Diagnosis not present

## 2021-06-07 DIAGNOSIS — I10 Essential (primary) hypertension: Secondary | ICD-10-CM | POA: Diagnosis not present

## 2021-06-07 DIAGNOSIS — R7303 Prediabetes: Secondary | ICD-10-CM | POA: Diagnosis not present

## 2021-06-07 DIAGNOSIS — E559 Vitamin D deficiency, unspecified: Secondary | ICD-10-CM | POA: Diagnosis not present

## 2021-06-22 DIAGNOSIS — U071 COVID-19: Secondary | ICD-10-CM | POA: Diagnosis not present

## 2021-09-08 ENCOUNTER — Emergency Department (HOSPITAL_COMMUNITY)
Admission: EM | Admit: 2021-09-08 | Discharge: 2021-09-08 | Disposition: A | Discharge status: Home / Self Care | Payer: Medicare PPO | Attending: Emergency Medicine | Admitting: Emergency Medicine

## 2021-09-08 ENCOUNTER — Emergency Department (HOSPITAL_COMMUNITY): Payer: Medicare PPO

## 2021-09-08 ENCOUNTER — Encounter (HOSPITAL_COMMUNITY): Payer: Self-pay | Admitting: Emergency Medicine

## 2021-09-08 DIAGNOSIS — R002 Palpitations: Secondary | ICD-10-CM | POA: Diagnosis not present

## 2021-09-08 DIAGNOSIS — I10 Essential (primary) hypertension: Secondary | ICD-10-CM | POA: Diagnosis not present

## 2021-09-08 DIAGNOSIS — Z79899 Other long term (current) drug therapy: Secondary | ICD-10-CM | POA: Diagnosis not present

## 2021-09-08 DIAGNOSIS — R9431 Abnormal electrocardiogram [ECG] [EKG]: Secondary | ICD-10-CM | POA: Diagnosis not present

## 2021-09-08 DIAGNOSIS — R000 Tachycardia, unspecified: Secondary | ICD-10-CM | POA: Insufficient documentation

## 2021-09-08 DIAGNOSIS — R0602 Shortness of breath: Secondary | ICD-10-CM | POA: Diagnosis not present

## 2021-09-08 HISTORY — DX: Essential (primary) hypertension: I10

## 2021-09-08 LAB — BASIC METABOLIC PANEL
Anion gap: 7 (ref 5–15)
BUN: 15 mg/dL (ref 8–23)
CO2: 26 mmol/L (ref 22–32)
Calcium: 10 mg/dL (ref 8.9–10.3)
Chloride: 104 mmol/L (ref 98–111)
Creatinine, Ser: 0.69 mg/dL (ref 0.44–1.00)
GFR, Estimated: >60 mL/min (ref >60)
Glucose, Bld: 138 mg/dL — ABNORMAL HIGH (ref 70–99)
Potassium: 4.2 mmol/L (ref 3.5–5.1)
Sodium: 137 mmol/L (ref 135–145)

## 2021-09-08 LAB — CBC WITH DIFFERENTIAL/PLATELET
Abs Immature Granulocytes: 0.01 10*3/uL (ref 0.00–0.07)
Basophils Absolute: 0 10*3/uL (ref 0.0–0.1)
Basophils Relative: 1 %
Eosinophils Absolute: 0.1 10*3/uL (ref 0.0–0.5)
Eosinophils Relative: 1 %
HCT: 36 % (ref 36.0–46.0)
Hemoglobin: 12.1 g/dL (ref 12.0–15.0)
Immature Granulocytes: 0 %
Lymphocytes Relative: 22 %
Lymphs Abs: 1.2 10*3/uL (ref 0.7–4.0)
MCH: 32.7 pg (ref 26.0–34.0)
MCHC: 33.6 g/dL (ref 30.0–36.0)
MCV: 97.3 fL (ref 80.0–100.0)
Monocytes Absolute: 0.6 10*3/uL (ref 0.1–1.0)
Monocytes Relative: 10 %
Neutro Abs: 3.7 10*3/uL (ref 1.7–7.7)
Neutrophils Relative %: 66 %
Platelets: 287 10*3/uL (ref 150–400)
RBC: 3.7 MIL/uL — ABNORMAL LOW (ref 3.87–5.11)
RDW: 13.5 % (ref 11.5–15.5)
WBC: 5.5 10*3/uL (ref 4.0–10.5)
nRBC: 0 % (ref 0.0–0.2)

## 2021-09-08 LAB — TSH: TSH: 3.178 u[IU]/mL (ref 0.350–4.500)

## 2021-09-08 LAB — TROPONIN I (HIGH SENSITIVITY)
Troponin I (High Sensitivity): 16 ng/L (ref <18)
Troponin I (High Sensitivity): 15 ng/L (ref <18)

## 2021-09-08 LAB — CBG MONITORING, ED: Glucose-Capillary: 106 mg/dL — ABNORMAL HIGH (ref 70–99)

## 2021-09-08 LAB — D-DIMER, QUANTITATIVE: D-Dimer, Quant: <0.27 ug/mL-FEU (ref 0.00–0.50)

## 2021-09-08 MED ORDER — METOPROLOL TARTRATE 25 MG PO TABS: 12.5000 mg | ORAL_TABLET | Freq: Two times a day (BID) | ORAL | 0 refills | Status: DC

## 2021-09-08 MED ORDER — METOPROLOL TARTRATE 5 MG/5ML IV SOLN
5.0000 mg | Freq: Once | INTRAVENOUS | Status: AC
  Administered 2021-09-08: 5 mg via INTRAVENOUS
  Filled 2021-09-08: qty 5

## 2021-09-08 MED ORDER — SODIUM CHLORIDE 0.9 % IV BOLUS
1000.0000 mL | Freq: Once | INTRAVENOUS | Status: AC
  Administered 2021-09-08: 1000 mL via INTRAVENOUS

## 2021-09-08 NOTE — ED Notes (Signed)
All discharge instructions reviewed with patient including follow up care and prescriptions and patient verbalized understanding of same. Patient stable and ambulatory at time of discharge.  ?

## 2021-09-08 NOTE — ED Provider Notes (Signed)
?[facility] ?Provider Note ? ? ?CSN: [id number] ?Arrival date & time: 09/08/21  1635 ? ?  ? ?History ? ?Chief Complaint  ?Patient presents with  ? Tachycardia  ? ? ?Natasha Hoover is a 76 y.o. female. ? ?Patient is a 76 year old female with past medical history of hypertension presenting for complaints of palpitations.  Patient states 2 days ago she began feeling palpitations and found her heart rate to be 140 bpm.  States today she felt the same way but now it was accompanied by a low blood pressure-unknown reading-which prompted her to call EMS.  Denies any shortness of breath or chest pain.  Patient midst to history of taking Benadryl 25 mg orally once daily for the past month for allergy symptoms.  Denies any dehydration including no creased p.o., vomiting, diarrhea.  No recent illnesses, cough, abdominal pain, dysuria. ? ?The history is provided by the patient. No language interpreter was used.  ? ?  ? ?Home Medications ?Prior to Admission medications   ?Medication Sig Start Date End Date Taking? Authorizing Provider  ?lisinopril (PRINIVIL,ZESTRIL) 5 MG tablet  07/31/14   [provider]  ?   ? ?Allergies    ?Patient has no allergy information on record.   ? ?Review of Systems   ?Review of Systems  ?Constitutional:  Negative for chills and fever.  ?HENT:  Negative for ear pain and sore throat.   ?Eyes:  Negative for pain and visual disturbance.  ?Respiratory:  Negative for cough and shortness of breath.   ?Cardiovascular:  Positive for palpitations. Negative for chest pain.  ?Gastrointestinal:  Negative for abdominal pain and vomiting.  ?Genitourinary:  Negative for dysuria and hematuria.  ?Musculoskeletal:  Negative for arthralgias and back pain.  ?Skin:  Negative for color change and rash.  ?Neurological:  Negative for seizures and syncope.  ?All other systems reviewed and are negative. ? ?Physical Exam ?Updated Vital Signs ?BP (!) 165/109   Pulse (!) 146    Temp 97.6 ?F (36.4 ?C) (Oral)   Resp 19   Ht 5\' 4"  (1.626 m)   Wt 54.4 kg   SpO2 95%   BMI 20.60 kg/m?  ?Physical Exam ?Vitals and nursing note reviewed.  ?Constitutional:   ?   General: She is not in acute distress. ?   Appearance: She is well-developed.  ?HENT:  ?   Head: Normocephalic and atraumatic.  ?Eyes:  ?   Conjunctiva/sclera: Conjunctivae normal.  ?Cardiovascular:  ?   Rate and Rhythm: Regular rhythm. Tachycardia present.  ?   Heart sounds: No murmur heard. ?Pulmonary:  ?   Effort: Pulmonary effort is normal. No respiratory distress.  ?   Breath sounds: Normal breath sounds.  ?Abdominal:  ?   Palpations: Abdomen is soft.  ?   Tenderness: There is no abdominal tenderness.  ?Musculoskeletal:     ?   General: No swelling.  ?   Cervical back: Neck supple.  ?Skin: ?   General: Skin is warm and dry.  ?   Capillary Refill: Capillary refill takes less than 2 seconds.  ?Neurological:  ?   Mental Status: She is alert.  ?Psychiatric:     ?   Mood and Affect: Mood normal.  ? ? ?ED Results / Procedures / Treatments   ?Labs ?(all labs ordered are listed, but only abnormal results are displayed) ?Labs Reviewed  ?CBC WITH DIFFERENTIAL/PLATELET - Abnormal; Notable for the following components:  ?    Result Value  ?  RBC 3.70 (*)   ? All other components within normal limits  ?CBG MONITORING, ED - Abnormal; Notable for the following components:  ? Glucose-Capillary 106 (*)   ? All other components within normal limits  ?BASIC METABOLIC PANEL  ?TSH  ?TROPONIN I (HIGH SENSITIVITY)  ? ? ?EKG ?None ? ?Radiology ?No results found. ? ?Procedures ?Procedures  ? ? ?Medications Ordered in ED ?Medications  ?sodium chloride 0.9 % bolus 1,000 mL (1,000 mLs Intravenous New Bag/Given 09/08/21 1652)  ? ? ?ED Course/ Medical Decision Making/ A&P ?  ?                        ?Medical Decision Making ?Amount and/or Complexity of Data Reviewed ?Labs: ordered. ?Radiology: ordered. ? ? ?5:39 PM ? ?76 year old female with past medical history  of hypertension presenting for complaints of palpitations.  Patient is alert and oriented x3, no acute distress, afebrile, sinus tachycardia with a rate of 143 bpm with otherwise stable vital signs.  Blood pressure 163/105. ? ?Patient denies any chest pain or shortness of breath. ? ?History of taking Benadryl ? ? ? ? ? ? ?Final Clinical Impression(s) / ED Diagnoses ?Final diagnoses:  ?None  ? ? ?Rx / DC Orders ?ED Discharge Orders   ? ? None  ? ?  ? ? ?  ?Lianne Cure, DO [phone] ? ?

## 2021-09-08 NOTE — ED Triage Notes (Signed)
Pt arrives via EMS from PCP with tachycardia. Pt states she woke up Tuesday morning and felt her heart racing in 140s. Pt went to PCP today due to elevated HR and low BP. Pt had covid in February and PCP sent for possible thyroid issues.  ?

## 2021-09-13 ENCOUNTER — Ambulatory Visit: Payer: Medicare PPO | Admitting: Internal Medicine

## 2021-09-13 ENCOUNTER — Ambulatory Visit (INDEPENDENT_AMBULATORY_CARE_PROVIDER_SITE_OTHER): Payer: Medicare PPO

## 2021-09-13 ENCOUNTER — Encounter: Payer: Self-pay | Admitting: Internal Medicine

## 2021-09-13 VITALS — BP 140/82 | HR 58 | Ht 65.0 in | Wt 119.8 lb

## 2021-09-13 DIAGNOSIS — R000 Tachycardia, unspecified: Secondary | ICD-10-CM

## 2021-09-13 DIAGNOSIS — R9431 Abnormal electrocardiogram [ECG] [EKG]: Secondary | ICD-10-CM

## 2021-09-13 NOTE — Progress Notes (Unsigned)
Enrolled for [employer] to mail a ZIO XT long term holter monitor to the patients address on file.  

## 2021-09-13 NOTE — Progress Notes (Signed)
?Cardiology Office Note:   ? ?Date:  09/13/2021  ? ?ID:  Natasha Hoover, DOB 12-09-1945, MRN [id number] ? ?PCP:  Cari Caraway, MD ?  ?[facility] Providers ?Cardiologist:  None    ? ?Referring MD: Cari Caraway, MD  ? ?No chief complaint on file. ?Atrial tachycardia ? ?History of Present Illness:   ? ?Natasha Hoover is a 76 y.o. female with a hx of HTN, referral for tachycardia from the ED  ? ?She presented to the ED 5/4 for palpitations and reported HR 140. She notes SOB with this. She denies infectious symptoms. She was not having any issues. She was started on metoprolol and feels better . TSH is normal. She has no hx of cardiac disease history. Former light smoker, > 30 years ago. No stress test in the past. No CP or SOB. Her heart rates have been normal. Normal Bps ? ?Past Medical History:  ?Diagnosis Date  ? Hypertension   ? ? ?Past Surgical History:  ?Procedure Laterality Date  ? BREAST EXCISIONAL BIOPSY Right   ? ? ?Current Medications: ?Current Meds  ?Medication Sig  ? ADVIL DUAL ACTION 125-250 MG TABS Take 2 tablets by mouth in the morning and at bedtime.  ? alendronate (FOSAMAX) 70 MG tablet Take 70 mg by mouth every Tuesday.  ? atorvastatin (LIPITOR) 10 MG tablet Take 10 mg by mouth at bedtime.  ? BIOTIN PO Take 1 tablet by mouth daily with breakfast.  ? metoprolol tartrate (LOPRESSOR) 25 MG tablet Take 0.5 tablets (12.5 mg total) by mouth 2 (two) times daily.  ? Misc Natural Products (JOINT HEALTH) CAPS Take 1 capsule by mouth daily with breakfast.  ? Multiple Vitamins-Minerals (CENTRUM SILVER 50+WOMEN) TABS Take 1 tablet by mouth daily with breakfast.  ? NON FORMULARY See admin instructions. Vital Proteins Collagen Peptides Advanced - with Hyaluronic Acid & Vitamin C powder- Mix 2 tablespoonsful of powder into 2 cups of coffe and drink once a day  ?  ? ?Allergies:   Patient has no known allergies.  ? ?Social History  ? ?Socioeconomic History  ? Marital status: Married  ?  Spouse name: Not on  file  ? Number of children: Not on file  ? Years of education: Not on file  ? Highest education level: Not on file  ?Occupational History  ? Not on file  ?Tobacco Use  ? Smoking status: Never  ? Smokeless tobacco: Never  ?Substance and Sexual Activity  ? Alcohol use: Not on file  ? Drug use: Not on file  ? Sexual activity: Not on file  ?Other Topics Concern  ? Not on file  ?Social History Narrative  ? Not on file  ? ?Social Determinants of Health  ? ?Financial Resource Strain: Not on file  ?Food Insecurity: Not on file  ?Transportation Needs: Not on file  ?Physical Activity: Not on file  ?Stress: Not on file  ?Social Connections: Not on file  ?  ? ?Family History: ?The patient's family history includes Breast cancer in her maternal grandmother and paternal grandmother. ? ?ROS:   ?Please see the history of present illness.    All other systems reviewed and are negative. ? ?EKGs/Labs/Other Studies Reviewed:   ? ?The following studies were reviewed today: ? ? ?EKG:  EKG is  ordered today.  The ekg ordered today demonstrates  ? ?Sinus bradycardia HR 58 bpm with PACs ? ?Recent Labs: ?09/08/2021: BUN 15; Creatinine, Ser 0.69; Hemoglobin 12.1; Platelets 287; Potassium 4.2; Sodium 137; TSH 3.178  ?  Recent Lipid Panel ?No results found for: CHOL, TRIG, HDL, CHOLHDL, VLDL, LDLCALC, LDLDIRECT ? ? ?Risk Assessment/Calculations:   ?  ? ?    ? ?Physical Exam:   ? ?VS:   ? ?Vitals:  ? 09/13/21 1401  ?BP: 140/82  ?Pulse: (!) 58  ?SpO2: 98%  ? ? ? ?Wt Readings from Last 3 Encounters:  ?09/13/21 119 lb 12.8 oz (54.3 kg)  ?09/08/21 120 lb (54.4 kg)  ?01/29/18 124 lb (56.2 kg)  ?  ? ?GEN:  Well nourished, well developed in no acute distress ?HEENT: Normal ?NECK: No JVD; No carotid bruits ?LYMPHATICS: No lymphadenopathy ?CARDIAC: RRR, no murmurs, rubs, gallops ?RESPIRATORY:  Clear to auscultation without rales, wheezing or rhonchi  ?ABDOMEN: Soft, non-tender, non-distended ?MUSCULOSKELETAL:  No edema; No deformity  ?SKIN: Warm and  dry ?NEUROLOGIC:  Alert and oriented x 3 ?PSYCHIATRIC:  Normal affect  ? ?ASSESSMENT:   ? ?#AT: run of AT in the ED captured on EKG 5/5 139bpm.. Low dose metoprolol has helped her symptoms. Will get a zio to ensure no other arrhythmia. Otherwise, this is a benign arrhythmia that can be managed with medications. If persistent despite medications, would refer  to EP for consideration of EP study and ablation ? ? ?PLAN:   ? ?In order of problems listed above: ? ?7 day ziopatch ?Follow up 3 months ? ?   ? ?   ? ? ?Medication Adjustments/Labs and Tests Ordered: ?Current medicines are reviewed at length with the patient today.  Concerns regarding medicines are outlined above.  ?Orders Placed This Encounter  ?Procedures  ? LONG TERM MONITOR (3-14 DAYS)  ? EKG 12-Lead  ? ?No orders of the defined types were placed in this encounter. ? ? ?Patient Instructions  ?Medication Instructions:  ?Your physician recommends that you continue on your current medications as directed. Please refer to the Current Medication list given to you today. ? ?*If you need a refill on your cardiac medications before your next appointment, please call your pharmacy* ? ?Testing/Procedures: ?ZIO XT- Long Term Monitor Instructions  ? ?Your physician has requested you wear a ZIO patch monitor for __7_ days.  ?This is a single patch monitor.   [employer] supplies one patch monitor per enrollment. Additional stickers are not available. Please do not apply patch if you will be having a Nuclear Stress Test, Echocardiogram, Cardiac CT, MRI, or Chest Xray during the period you would be wearing the monitor. The patch cannot be worn during these tests. You cannot remove and re-apply the ZIO XT patch monitor.  ?Your ZIO patch monitor will be sent Fed Ex from [employer] directly to your home address. It may take 3-5 days to receive your monitor after you have been enrolled.  ?Once you have received your monitor, please review the enclosed instructions.  Your monitor has already been registered assigning a specific monitor serial # to you. ? ?Billing and Patient Assistance Program Information  ? ?We have supplied [employer] with any of your insurance information on file for billing purposes. ?[employer] offers a sliding scale Patient Assistance Program for patients that do not have insurance, or whose insurance does not completely cover the cost of the ZIO monitor.   You must apply for the Patient Assistance Program to qualify for this discounted rate.     To apply, please call [employer] at [phone], select option 4, then select option 2, and ask to apply for Patient Assistance Program.  Theodore Demark will ask your household income, and how many  people are in your household.  They will quote your out-of-pocket cost based on that information.  [employer] will also be able to set up a 44-month, interest-free payment plan if needed. ? ?Applying the monitor  ? ?Shave hair from upper left chest.  ?Hold abrader disc by orange tab. Rub abrader in 40 strokes over the upper left chest as indicated in your monitor instructions.  ?Clean area with 4 enclosed alcohol pads. Let dry.  ?Apply patch as indicated in monitor instructions. Patch will be placed under collarbone on left side of chest with arrow pointing upward.  ?Rub patch adhesive wings for 2 minutes. Remove white label marked "1". Remove the white label marked "2". Rub patch adhesive wings for 2 additional minutes.  ?While looking in a mirror, press and release button in center of patch. A small green light will flash 3-4 times. This will be your only indicator that the monitor has been turned on. ?  ?Do not shower for the first 24 hours. You may shower after the first 24 hours.  ?Press the button if you feel a symptom. You will hear a small click. Record Date, Time and Symptom in the Patient Logbook.  ?When you are ready to remove the patch, follow instructions on the last 2 pages of the Patient Logbook. Stick patch monitor onto  the last page of Patient Logbook.  ?Place Patient Logbook in the blue and white box.  Use locking tab on box and tape box closed securely.  The blue and white box has prepaid postage on it. Please place it in the ma

## 2021-09-13 NOTE — Patient Instructions (Signed)
Medication Instructions:  ?Your physician recommends that you continue on your current medications as directed. Please refer to the Current Medication list given to you today. ? ?*If you need a refill on your cardiac medications before your next appointment, please call your pharmacy* ? ?Testing/Procedures: ?ZIO XT- Long Term Monitor Instructions  ? ?Your physician has requested you wear a ZIO patch monitor for __7_ days.  ?This is a single patch monitor.   [employer] supplies one patch monitor per enrollment. Additional stickers are not available. Please do not apply patch if you will be having a Nuclear Stress Test, Echocardiogram, Cardiac CT, MRI, or Chest Xray during the period you would be wearing the monitor. The patch cannot be worn during these tests. You cannot remove and re-apply the ZIO XT patch monitor.  ?Your ZIO patch monitor will be sent Fed Ex from [employer] directly to your home address. It may take 3-5 days to receive your monitor after you have been enrolled.  ?Once you have received your monitor, please review the enclosed instructions. Your monitor has already been registered assigning a specific monitor serial # to you. ? ?Billing and Patient Assistance Program Information  ? ?We have supplied [employer] with any of your insurance information on file for billing purposes. ?[employer] offers a sliding scale Patient Assistance Program for patients that do not have insurance, or whose insurance does not completely cover the cost of the ZIO monitor.   You must apply for the Patient Assistance Program to qualify for this discounted rate.     To apply, please call [employer] at [phone], select option 4, then select option 2, and ask to apply for Patient Assistance Program.  Meredeth Ide will ask your household income, and how many people are in your household.  They will quote your out-of-pocket cost based on that information.  [employer] will also be able to set up a 65-month, interest-free payment plan  if needed. ? ?Applying the monitor  ? ?Shave hair from upper left chest.  ?Hold abrader disc by orange tab. Rub abrader in 40 strokes over the upper left chest as indicated in your monitor instructions.  ?Clean area with 4 enclosed alcohol pads. Let dry.  ?Apply patch as indicated in monitor instructions. Patch will be placed under collarbone on left side of chest with arrow pointing upward.  ?Rub patch adhesive wings for 2 minutes. Remove white label marked "1". Remove the white label marked "2". Rub patch adhesive wings for 2 additional minutes.  ?While looking in a mirror, press and release button in center of patch. A small green light will flash 3-4 times. This will be your only indicator that the monitor has been turned on. ?  ?Do not shower for the first 24 hours. You may shower after the first 24 hours.  ?Press the button if you feel a symptom. You will hear a small click. Record Date, Time and Symptom in the Patient Logbook.  ?When you are ready to remove the patch, follow instructions on the last 2 pages of the Patient Logbook. Stick patch monitor onto the last page of Patient Logbook.  ?Place Patient Logbook in the blue and white box.  Use locking tab on box and tape box closed securely.  The blue and white box has prepaid postage on it. Please place it in the mailbox as soon as possible. Your physician should have your test results approximately 7 days after the monitor has been mailed back to [facility].  ?Call [facility]  at [phone] if you have questions regarding your ZIO XT patch monitor. Call them immediately if you see an orange light blinking on your monitor.  ?If your monitor falls off in less than 4 days, contact our Monitor department at [phone]. ?If your monitor becomes loose or falls off after 4 days call [employer] at [phone] for suggestions on securing your monitor.? ? ? ?Follow-Up: ?At [facility], you and your health needs are our priority.  As part  of our continuing mission to provide you with exceptional heart care, we have created designated Provider Care Teams.  These Care Teams include your primary Cardiologist (physician) and Advanced Practice Providers (APPs -  Physician Assistants and Nurse Practitioners) who all work together to provide you with the care you need, when you need it. ? ?We recommend signing up for the patient portal called "MyChart".  Sign up information is provided on this After Visit Summary.  MyChart is used to connect with patients for Virtual Visits (Telemedicine).  Patients are able to view lab/test results, encounter notes, upcoming appointments, etc.  Non-urgent messages can be sent to your provider as well.   ?To learn more about what you can do with MyChart, go to [email or web address].   ? ?Your next appointment:   ?3 month(s) ? ?The format for your next appointment:   ?In Person ? ?Provider:   ?Dr. Wyline Mood ? ?Important Information About Sugar ? ? ? ? ? ? ?

## 2021-09-16 DIAGNOSIS — R000 Tachycardia, unspecified: Secondary | ICD-10-CM | POA: Diagnosis not present

## 2021-09-16 DIAGNOSIS — R9431 Abnormal electrocardiogram [ECG] [EKG]: Secondary | ICD-10-CM

## 2021-09-29 DIAGNOSIS — R000 Tachycardia, unspecified: Secondary | ICD-10-CM | POA: Diagnosis not present

## 2021-09-29 DIAGNOSIS — R9431 Abnormal electrocardiogram [ECG] [EKG]: Secondary | ICD-10-CM | POA: Diagnosis not present

## 2021-10-04 ENCOUNTER — Telehealth: Payer: Self-pay | Admitting: Internal Medicine

## 2021-10-04 NOTE — Telephone Encounter (Signed)
*  STAT* If patient is at the pharmacy, call can be transferred to refill team.   1. Which medications need to be refilled? (please list name of each medication and dose if known) metoprolol tartrate (LOPRESSOR) 25 MG tablet  2. Which pharmacy/location (including street and city if local pharmacy) is medication to be sent to? [facility] [phone] - [place], [place] - [address]  3. Do they need a 30 day or 90 day supply? 90

## 2021-10-05 ENCOUNTER — Other Ambulatory Visit: Payer: Self-pay

## 2021-10-05 MED ORDER — METOPROLOL TARTRATE 25 MG PO TABS: 12.5000 mg | ORAL_TABLET | Freq: Two times a day (BID) | ORAL | 3 refills | Status: DC

## 2021-10-07 ENCOUNTER — Ambulatory Visit: Payer: Medicare PPO | Admitting: Cardiovascular Disease

## 2021-10-10 DIAGNOSIS — Z1211 Encounter for screening for malignant neoplasm of colon: Secondary | ICD-10-CM | POA: Diagnosis not present

## 2021-10-10 DIAGNOSIS — Z1212 Encounter for screening for malignant neoplasm of rectum: Secondary | ICD-10-CM | POA: Diagnosis not present

## 2021-10-19 LAB — COLOGUARD: COLOGUARD: POSITIVE — AB

## 2021-11-18 DIAGNOSIS — R195 Other fecal abnormalities: Secondary | ICD-10-CM | POA: Diagnosis not present

## 2021-11-22 ENCOUNTER — Telehealth: Payer: Self-pay

## 2021-11-22 NOTE — Telephone Encounter (Signed)
   Pre-operative Risk Assessment    Patient Name: JESSILYNN TAFT  DOB: [birth year] MRN: [id number]      Request for Surgical Clearance    Procedure:   Colonoscopy  Date of Surgery:  Clearance 02/06/22                                 Surgeon:  Dr. Willis Modena Surgeon's Group or Practice Name:  [facility] Phone number:  [phone] Fax number:  [phone]   Type of Clearance Requested:   - Medical    Type of Anesthesia:   Propofol   Additional requests/questions:  Please advise surgeon/provider what medications should be held.  Signed, Irena Cords Kizzie Cotten   11/22/2021, 3:06 PM

## 2021-11-24 NOTE — Telephone Encounter (Signed)
Primary Cardiologist:Branch, Alben Spittle, MD  Chart reviewed as part of pre-operative protocol coverage for  Joshalyn K Spanos.  Pre-op covering staff: - Patient has appointment scheduled with Dr. Wyline Mood for 12/14/21. Documentation made in appointment notes and request routed to Dr. Wyline Mood for her awareness.  - Please contact requesting surgeon's office via preferred method (i.e, phone, fax) to inform them of need for appointment prior to surgery.   Levi Aland, NP-C    11/24/2021, 11:02 AM [facility] [address] Office [phone] Fax [phone]

## 2021-11-24 NOTE — Telephone Encounter (Signed)
I have faxed over recommendations to requesting surgeon's office. 

## 2021-11-29 DIAGNOSIS — I10 Essential (primary) hypertension: Secondary | ICD-10-CM | POA: Diagnosis not present

## 2021-11-29 DIAGNOSIS — R7303 Prediabetes: Secondary | ICD-10-CM | POA: Diagnosis not present

## 2021-11-29 DIAGNOSIS — M810 Age-related osteoporosis without current pathological fracture: Secondary | ICD-10-CM | POA: Diagnosis not present

## 2021-12-14 ENCOUNTER — Encounter: Payer: Self-pay | Admitting: Internal Medicine

## 2021-12-14 ENCOUNTER — Ambulatory Visit (INDEPENDENT_AMBULATORY_CARE_PROVIDER_SITE_OTHER): Payer: Medicare PPO | Admitting: Internal Medicine

## 2021-12-14 VITALS — BP 144/82 | HR 61 | Ht 65.0 in | Wt 126.8 lb

## 2021-12-14 DIAGNOSIS — R002 Palpitations: Secondary | ICD-10-CM | POA: Diagnosis not present

## 2021-12-14 MED ORDER — LISINOPRIL 5 MG PO TABS: 5.0000 mg | ORAL_TABLET | Freq: Every day | ORAL | 3 refills | Status: AC

## 2021-12-14 MED ORDER — METOPROLOL SUCCINATE ER 25 MG PO TB24: 12.5000 mg | ORAL_TABLET | Freq: Every day | ORAL | 3 refills | Status: DC

## 2021-12-14 NOTE — Patient Instructions (Signed)
Medication Instructions:   STOP METOPROLOL TARTRATE   START: METOPROLOL SUCCINATE 12.5mg  ONCE DAILY   START: LISINOPRIL 5mg  ONCE DAILY   *If you need a refill on your cardiac medications before your next appointment, please call your pharmacy*  Lab Work: None Ordered At This Time.  If you have labs (blood work) drawn today and your tests are completely normal, you will receive your results only by: MyChart Message (if you have MyChart) OR A paper copy in the mail If you have any lab test that is abnormal or we need to change your treatment, we will call you to review the results.  Testing/Procedures: None Ordered At This Time.   Follow-Up: At [facility], you and your health needs are our priority.  As part of our continuing mission to provide you with exceptional heart care, we have created designated Provider Care Teams.  These Care Teams include your primary Cardiologist (physician) and Advanced Practice Providers (APPs -  Physician Assistants and Nurse Practitioners) who all work together to provide you with the care you need, when you need it.  Your next appointment:   6 month(s)  The format for your next appointment:   In Person  Provider:   [facility], MD

## 2021-12-14 NOTE — Progress Notes (Signed)
Cardiology Office Note:    Date:  12/14/2021   ID:  SIDNY RAMADAN, DOB [birth year], MRN [id number]  PCP:  Cari Caraway, MD   [facility] Cardiologist:  Janina Mayo, MD     Referring MD: Cari Caraway, MD   No chief complaint on file. Atrial tachycardia  History of Present Illness:    Natasha Hoover is a 76 y.o. female with a hx of HTN, referral for tachycardia from the ED   She presented to the ED 5/4 for palpitations and reported HR 140. She notes SOB with this. She denies infectious symptoms. She was not having any issues. She was started on metoprolol and feels better . TSH is normal. She has no hx of cardiac disease history. Former light smoker, > 30 years ago. No stress test in the past. No CP or SOB. Her heart rates have been normal. Normal Bps  Interim Hx 8/9 She had minimal PAT.This was on metoprolol 12.5 mg BID. She feels more winded and notes lower heart rates after stopping the metoprolol. She notes palpitations at night. She denies chest pressure. She can get tightness for 1 second, like a tightening muscle.  Past Medical History:  Diagnosis Date   Hypertension     Past Surgical History:  Procedure Laterality Date   BREAST EXCISIONAL BIOPSY Right     Current Medications: Current Meds  Medication Sig   ADVIL DUAL ACTION 125-250 MG TABS Take 2 tablets by mouth in the morning and at bedtime.   alendronate (FOSAMAX) 70 MG tablet Take 70 mg by mouth every Tuesday.   atorvastatin (LIPITOR) 10 MG tablet Take 10 mg by mouth at bedtime.   BIOTIN PO Take 1 tablet by mouth daily with breakfast.   lisinopril (ZESTRIL) 5 MG tablet Take 1 tablet (5 mg total) by mouth daily.   metoprolol succinate (TOPROL-XL) 25 MG 24 hr tablet Take 0.5 tablets (12.5 mg total) by mouth daily. Take with or immediately following a meal.   Misc Natural Products (JOINT HEALTH) CAPS Take 1 capsule by mouth daily with breakfast.   Multiple Vitamins-Minerals (CENTRUM SILVER  50+WOMEN) TABS Take 1 tablet by mouth daily with breakfast.   NON FORMULARY See admin instructions. Vital Proteins Collagen Peptides Advanced - with Hyaluronic Acid & Vitamin C powder- Mix 2 tablespoonsful of powder into 2 cups of coffe and drink once a day   [DISCONTINUED] metoprolol tartrate (LOPRESSOR) 25 MG tablet Take 0.5 tablets (12.5 mg total) by mouth 2 (two) times daily.     Allergies:   Patient has no known allergies.   Social History   Socioeconomic History   Marital status: Married    Spouse name: Not on file   Number of children: Not on file   Years of education: Not on file   Highest education level: Not on file  Occupational History   Not on file  Tobacco Use   Smoking status: Never   Smokeless tobacco: Never  Substance and Sexual Activity   Alcohol use: Not on file   Drug use: Not on file   Sexual activity: Not on file  Other Topics Concern   Not on file  Social History Narrative   Not on file   Social Determinants of Health   Financial Resource Strain: Not on file  Food Insecurity: Not on file  Transportation Needs: Not on file  Physical Activity: Not on file  Stress: Not on file  Social Connections: Not on file  Family History: The patient's family history includes Breast cancer in her maternal grandmother and paternal grandmother.  ROS:   Please see the history of present illness.    All other systems reviewed and are negative.  EKGs/Labs/Other Studies Reviewed:    The following studies were reviewed today:   EKG:  EKG is  ordered today.  The ekg ordered today demonstrates   Sinus bradycardia HR 58 bpm with PACs  Recent Labs: 09/08/2021: BUN 15; Creatinine, Ser 0.69; Hemoglobin 12.1; Platelets 287; Potassium 4.2; Sodium 137; TSH 3.178  Recent Lipid Panel No results found for: "CHOL", "TRIG", "HDL", "CHOLHDL", "VLDL", "LDLCALC", "LDLDIRECT"   Risk Assessment/Calculations:           Physical Exam:    VS:    Vitals:   12/14/21  1508  BP: (!) 144/82  Pulse: 61  SpO2: 98%     Wt Readings from Last 3 Encounters:  12/14/21 126 lb 12.8 oz (57.5 kg)  09/13/21 119 lb 12.8 oz (54.3 kg)  09/08/21 120 lb (54.4 kg)     GEN:  Well nourished, well developed in no acute distress HEENT: Normal NECK: No JVD; No carotid bruits LYMPHATICS: No lymphadenopathy CARDIAC: RRR, no murmurs, rubs, gallops RESPIRATORY:  Clear to auscultation without rales, wheezing or rhonchi  ABDOMEN: Soft, non-tender, non-distended MUSCULOSKELETAL:  No edema; No deformity  SKIN: Warm and dry NEUROLOGIC:  Alert and oriented x 3 PSYCHIATRIC:  Normal affect   ASSESSMENT:    #PAT: run of AT in the ED captured on EKG 5/5 139bpm.. Low dose metoprolol has helped her symptoms. Her ziopatch showed brief runs of AT, longest interval was 18 beats. This is benign. Otherwise, this is a benign arrhythmia that can be managed with medications. It is not imperative she continues BB. She noted activity intolerance with metoprolol, will reduce the dose for now.  Pre-OP: RCRI Class I, she has a low risk for CVD event for a low risk procedure. She can proceed with colonoscopy, can take her BB prior to the procedure  HTN: Borderline. Adding lisinopril 5 mg daily   PLAN:    In order of problems listed above:  Changed to metop XL 12.5 mg Lisinopril 5 mg daily  Acceptable cardiac risk for colonoscopy Follow up in 6 months           Medication Adjustments/Labs and Tests Ordered: Current medicines are reviewed at length with the patient today.  Concerns regarding medicines are outlined above.  Orders Placed This Encounter  Procedures   EKG 12-Lead   Meds ordered this encounter  Medications   metoprolol succinate (TOPROL-XL) 25 MG 24 hr tablet    Sig: Take 0.5 tablets (12.5 mg total) by mouth daily. Take with or immediately following a meal.    Dispense:  45 tablet    Refill:  3   lisinopril (ZESTRIL) 5 MG tablet    Sig: Take 1 tablet (5 mg total)  by mouth daily.    Dispense:  90 tablet    Refill:  3    Patient Instructions  Medication Instructions:   STOP METOPROLOL TARTRATE   START: METOPROLOL SUCCINATE 12.5mg  ONCE DAILY   START: LISINOPRIL 5mg  ONCE DAILY   *If you need a refill on your cardiac medications before your next appointment, please call your pharmacy*  Lab Work: None Ordered At This Time.  If you have labs (blood work) drawn today and your tests are completely normal, you will receive your results only by: MyChart Message (if you  have MyChart) OR A paper copy in the mail If you have any lab test that is abnormal or we need to change your treatment, we will call you to review the results.  Testing/Procedures: None Ordered At This Time.   Follow-Up: At [facility], you and your health needs are our priority.  As part of our continuing mission to provide you with exceptional heart care, we have created designated Provider Care Teams.  These Care Teams include your primary Cardiologist (physician) and Advanced Practice Providers (APPs -  Physician Assistants and Nurse Practitioners) who all work together to provide you with the care you need, when you need it.  Your next appointment:   6 month(s)  The format for your next appointment:   In Person  Provider:   Maisie Fus, MD         Signed, Maisie Fus, MD  12/14/2021 3:34 PM    [facility]

## 2022-02-06 DIAGNOSIS — R195 Other fecal abnormalities: Secondary | ICD-10-CM | POA: Diagnosis not present

## 2022-02-06 DIAGNOSIS — K648 Other hemorrhoids: Secondary | ICD-10-CM | POA: Diagnosis not present

## 2022-02-06 DIAGNOSIS — K5730 Diverticulosis of large intestine without perforation or abscess without bleeding: Secondary | ICD-10-CM | POA: Diagnosis not present

## 2022-02-06 DIAGNOSIS — Z1211 Encounter for screening for malignant neoplasm of colon: Secondary | ICD-10-CM | POA: Diagnosis not present

## 2022-03-21 DIAGNOSIS — H53143 Visual discomfort, bilateral: Secondary | ICD-10-CM | POA: Diagnosis not present

## 2022-03-21 DIAGNOSIS — H524 Presbyopia: Secondary | ICD-10-CM | POA: Diagnosis not present

## 2022-03-21 DIAGNOSIS — H52223 Regular astigmatism, bilateral: Secondary | ICD-10-CM | POA: Diagnosis not present

## 2022-03-21 DIAGNOSIS — I10 Essential (primary) hypertension: Secondary | ICD-10-CM | POA: Diagnosis not present

## 2022-03-21 DIAGNOSIS — Z9849 Cataract extraction status, unspecified eye: Secondary | ICD-10-CM | POA: Diagnosis not present

## 2022-03-21 DIAGNOSIS — H04123 Dry eye syndrome of bilateral lacrimal glands: Secondary | ICD-10-CM | POA: Diagnosis not present

## 2022-03-21 DIAGNOSIS — Z961 Presence of intraocular lens: Secondary | ICD-10-CM | POA: Diagnosis not present

## 2022-03-21 DIAGNOSIS — H5211 Myopia, right eye: Secondary | ICD-10-CM | POA: Diagnosis not present

## 2022-03-21 DIAGNOSIS — H35033 Hypertensive retinopathy, bilateral: Secondary | ICD-10-CM | POA: Diagnosis not present

## 2022-05-18 ENCOUNTER — Other Ambulatory Visit: Payer: Self-pay | Admitting: Family Medicine

## 2022-05-18 DIAGNOSIS — Z1231 Encounter for screening mammogram for malignant neoplasm of breast: Secondary | ICD-10-CM

## 2022-06-05 DIAGNOSIS — L570 Actinic keratosis: Secondary | ICD-10-CM | POA: Diagnosis not present

## 2022-06-12 ENCOUNTER — Ambulatory Visit: Payer: Medicare PPO | Attending: Internal Medicine | Admitting: Internal Medicine

## 2022-06-12 ENCOUNTER — Encounter: Payer: Self-pay | Admitting: Internal Medicine

## 2022-06-12 VITALS — BP 132/74 | HR 59 | Ht 65.0 in | Wt 124.0 lb

## 2022-06-12 DIAGNOSIS — R002 Palpitations: Secondary | ICD-10-CM | POA: Diagnosis not present

## 2022-06-12 NOTE — Progress Notes (Signed)
Cardiology Office Note:    Date:  06/12/2022   ID:  Natasha Hoover, DOB Sep 14, 1945, MRN [id number]  PCP:  Cari Caraway, MD   [place] [facility] Cardiologist:  Janina Mayo, MD     Referring MD: Cari Caraway, MD   No chief complaint on file. Atrial tachycardia  History of Present Illness:    Natasha Hoover is a 77 y.o. female with a hx of HTN, referral for tachycardia from the ED   She presented to the ED 5/4 for palpitations and reported HR 140. She notes SOB with this. She denies infectious symptoms. She was not having any issues. She was started on metoprolol and feels better . TSH is normal. She has no hx of cardiac disease history. Former light smoker, > 30 years ago. No stress test in the past. No CP or SOB. Her heart rates have been normal. Normal Bps  Interim Hx 8/9 She had minimal PAT.This was on metoprolol 12.5 mg BID. She feels more winded and notes lower heart rates after stopping the metoprolol. She notes palpitations at night. She denies chest pressure. She can get tightness for 1 second, like a tightening muscle.  Interim Hx 06/12/2022 Last visit she was a pre-op for colonoscopy. Noted that her HR was 140 bpm. This was after the prep.   Past Medical History:  Diagnosis Date   Hypertension     Past Surgical History:  Procedure Laterality Date   BREAST EXCISIONAL BIOPSY Right     Current Medications: Current Outpatient Medications on File Prior to Visit  Medication Sig Dispense Refill   ADVIL DUAL ACTION 125-250 MG TABS Take 2 tablets by mouth in the morning and at bedtime.     alendronate (FOSAMAX) 70 MG tablet Take 70 mg by mouth every Tuesday.     atorvastatin (LIPITOR) 10 MG tablet Take 10 mg by mouth at bedtime.     BIOTIN PO Take 1 tablet by mouth daily with breakfast.     lisinopril (ZESTRIL) 5 MG tablet Take 1 tablet (5 mg total) by mouth daily. 90 tablet 3   metoprolol succinate (TOPROL-XL) 25 MG 24 hr tablet Take 0.5 tablets (12.5 mg  total) by mouth daily. Take with or immediately following a meal. 45 tablet 3   Misc Natural Products (JOINT HEALTH) CAPS Take 1 capsule by mouth daily with breakfast.     Multiple Vitamins-Minerals (CENTRUM SILVER 50+WOMEN) TABS Take 1 tablet by mouth daily with breakfast.     NON FORMULARY See admin instructions. Vital Proteins Collagen Peptides Advanced - with Hyaluronic Acid & Vitamin C powder- Mix 2 tablespoonsful of powder into 2 cups of coffe and drink once a day     No current facility-administered medications on file prior to visit.     Allergies:   Patient has no known allergies.   Social History   Socioeconomic History   Marital status: Married    Spouse name: Not on file   Number of children: Not on file   Years of education: Not on file   Highest education level: Not on file  Occupational History   Not on file  Tobacco Use   Smoking status: Never   Smokeless tobacco: Never  Substance and Sexual Activity   Alcohol use: Not on file   Drug use: Not on file   Sexual activity: Not on file  Other Topics Concern   Not on file  Social History Narrative   Not on file   Social Determinants  of Health   Financial Resource Strain: Not on file  Food Insecurity: Not on file  Transportation Needs: Not on file  Physical Activity: Not on file  Stress: Not on file  Social Connections: Not on file     Family History: The patient's family history includes Breast cancer in her maternal grandmother and paternal grandmother.  ROS:   Please see the history of present illness.    All other systems reviewed and are negative.  EKGs/Labs/Other Studies Reviewed:    The following studies were reviewed today:   EKG:  EKG is  ordered today.  The ekg ordered today demonstrates   Sinus bradycardia HR 58 bpm with PACs  06/12/2022- sinus bradycardia 59 bpm  Recent Labs: 09/08/2021: BUN 15; Creatinine, Ser 0.69; Hemoglobin 12.1; Platelets 287; Potassium 4.2; Sodium 137; TSH 3.178    Recent Lipid Panel No results found for: "CHOL", "TRIG", "HDL", "CHOLHDL", "VLDL", "LDLCALC", "LDLDIRECT"   Risk Assessment/Calculations:           Physical Exam:    VS:    Vitals:   06/12/22 1516  BP: 132/74  Pulse: (!) 59  SpO2: 99%      Wt Readings from Last 3 Encounters:  12/14/21 126 lb 12.8 oz (57.5 kg)  09/13/21 119 lb 12.8 oz (54.3 kg)  09/08/21 120 lb (54.4 kg)     GEN:  Well nourished, well developed in no acute distress HEENT: Normal NECK: No JVD; No carotid bruits LYMPHATICS: No lymphadenopathy CARDIAC: RRR, no murmurs, rubs, gallops RESPIRATORY:  Clear to auscultation without rales, wheezing or rhonchi  ABDOMEN: Soft, non-tender, non-distended MUSCULOSKELETAL:  No edema; No deformity  SKIN: Warm and dry NEUROLOGIC:  Alert and oriented x 3 PSYCHIATRIC:  Normal affect   ASSESSMENT:    #PAT: run of AT in the ED captured on EKG 5/5 139bpm.. Low dose metoprolol has helped her symptoms. Her ziopatch showed brief runs of AT, longest interval was 18 beats. This is benign. Otherwise, this is a benign arrhythmia that can be managed with medications. It is not imperative she continues BB. She noted activity intolerance with metoprolol, will reduce the dose for now. - continue metop XL 12.5 mg daily  #HTN: Well controlled. Started lisinopril 5 mg daily   PLAN:    In order of problems listed above:   No changes today Follow up in 12 months      Medication Adjustments/Labs and Tests Ordered: Current medicines are reviewed at length with the patient today.  Concerns regarding medicines are outlined above.  No orders of the defined types were placed in this encounter.  No orders of the defined types were placed in this encounter.   There are no Patient Instructions on file for this visit.   Signed, Janina Mayo, MD  06/12/2022 3:00 PM    [facility]

## 2022-06-12 NOTE — Patient Instructions (Signed)
Medication Instructions:  Your physician recommends that you continue on your current medications as directed. Please refer to the Current Medication list given to you today.  *If you need a refill on your cardiac medications before your next appointment, please call your pharmacy*   Lab Work: NONE If you have labs (blood work) drawn today and your tests are completely normal, you will receive your results only by: MyChart Message (if you have MyChart) OR A paper copy in the mail If you have any lab test that is abnormal or we need to change your treatment, we will call you to review the results.   Testing/Procedures: NONE    Follow-Up: At [facility], you and your health needs are our priority.  As part of our continuing mission to provide you with exceptional heart care, we have created designated Provider Care Teams.  These Care Teams include your primary Cardiologist (physician) and Advanced Practice Providers (APPs -  Physician Assistants and Nurse Practitioners) who all work together to provide you with the care you need, when you need it.  We recommend signing up for the patient portal called "MyChart".  Sign up information is provided on this After Visit Summary.  MyChart is used to connect with patients for Virtual Visits (Telemedicine).  Patients are able to view lab/test results, encounter notes, upcoming appointments, etc.  Non-urgent messages can be sent to your provider as well.   To learn more about what you can do with MyChart, go to [email or web address].    Your next appointment:   1 year(s)  Provider:   Branch, Mary E, MD   

## 2022-06-13 DIAGNOSIS — E559 Vitamin D deficiency, unspecified: Secondary | ICD-10-CM | POA: Diagnosis not present

## 2022-06-13 DIAGNOSIS — M810 Age-related osteoporosis without current pathological fracture: Secondary | ICD-10-CM | POA: Diagnosis not present

## 2022-06-13 DIAGNOSIS — E782 Mixed hyperlipidemia: Secondary | ICD-10-CM | POA: Diagnosis not present

## 2022-06-13 DIAGNOSIS — I10 Essential (primary) hypertension: Secondary | ICD-10-CM | POA: Diagnosis not present

## 2022-06-13 DIAGNOSIS — R7303 Prediabetes: Secondary | ICD-10-CM | POA: Diagnosis not present

## 2022-06-13 DIAGNOSIS — Z0000 Encounter for general adult medical examination without abnormal findings: Secondary | ICD-10-CM | POA: Diagnosis not present

## 2022-07-07 ENCOUNTER — Ambulatory Visit
Admission: RE | Admit: 2022-07-07 | Discharge: 2022-07-07 | Disposition: A | Discharge status: Home / Self Care | Payer: Medicare PPO | Source: Ambulatory Visit

## 2022-07-07 DIAGNOSIS — Z1231 Encounter for screening mammogram for malignant neoplasm of breast: Secondary | ICD-10-CM | POA: Diagnosis not present

## 2022-07-27 ENCOUNTER — Encounter: Payer: Self-pay | Admitting: Podiatry

## 2022-07-27 ENCOUNTER — Ambulatory Visit: Payer: Medicare PPO | Admitting: Podiatry

## 2022-07-27 DIAGNOSIS — L03031 Cellulitis of right toe: Secondary | ICD-10-CM | POA: Diagnosis not present

## 2022-07-27 DIAGNOSIS — I7300 Raynaud's syndrome without gangrene: Secondary | ICD-10-CM | POA: Diagnosis not present

## 2022-07-27 NOTE — Patient Instructions (Addendum)
Raynaud's Phenomenon  Raynaud's phenomenon is a condition that affects the blood vessels (arteries) that carry blood to the fingers and toes. The arteries that supply blood to the ears, lips, nipples, or the tip of the nose might also be affected. Raynaud's phenomenon causes the arteries to become narrow temporarily (spasm). As a result, the flow of blood to the affected areas is temporarily decreased. This usually occurs in response to cold temperatures or stress. During an attack, the skin in the affected areas turns white, then blue, and finally red. A person may also feel tingling or numbness in those areas. Attacks usually last for only a brief period, and then the blood flow to the area returns to normal. In most cases, Raynaud's phenomenon does not cause serious health problems. What are the causes? In many cases, the cause of this condition is not known. The condition may occur on its own (primary Raynaud's phenomenon) or may be associated with other diseases or factors (secondary Raynaud's phenomenon). Possible causes may include: Diseases or medical conditions that damage the arteries. Injuries and repetitive actions that hurt the hands or feet. Being exposed to certain chemicals. Taking medicines that narrow the arteries. Other medical conditions, such as lupus, scleroderma, rheumatoid arthritis, thyroid problems, blood disorders, Sjogren syndrome, or atherosclerosis. What increases the risk? The following factors may make you more likely to develop this condition: Being 20-40 years old. Being female. Having a family history of Raynaud's phenomenon. Living in a cold climate. Smoking. What are the signs or symptoms? Symptoms of this condition usually occur when you are exposed to cold temperatures or when you have emotional stress. The symptoms may last for a few minutes or up to several hours. They usually affect your fingers but may also affect your toes, nipples, lips, ears, or the  tip of your nose. Symptoms may include: Changes in skin color. The skin in the affected areas will turn pale or white. The skin may then change from white to bluish to red as normal blood flow returns to the area. Numbness, tingling, or pain in the affected areas. In severe cases, symptoms may include: Skin sores. Tissues decaying and dying (gangrene). How is this diagnosed? This condition may be diagnosed based on: Your symptoms and medical history. A physical exam. During the exam, you may be asked to put your hands in cold water to check for a reaction to cold temperature. Tests, such as: Blood tests to check for other diseases or conditions. A test to check the movement of blood through your arteries and veins (vascular ultrasound). A test in which the skin at the base of your fingernail is examined under a microscope (nailfold capillaroscopy). How is this treated? During an episode, you can take actions to help symptoms go away faster. Options include moving your arms around in a windmill pattern, warming your fingers under warm water, or placing your fingers in a warm body fold, such as your armpit. Long-term treatment for this condition often involves making lifestyle changes and taking steps to control your exposure to cold temperature. For more severe cases, medicine (calcium channel blockers) may be used to improve blood circulation. Follow these instructions at home: Avoiding cold temperatures Take these steps to avoid exposure to cold: If possible, stay indoors during cold weather. When you go outside during cold weather, dress in layers and wear mittens, a hat, a scarf, and warm footwear. Wear mittens or gloves when handling ice or frozen food. Use holders for glasses or cans containing   cold drinks. Let warm water run for a while before taking a shower or bath. Warm up the car before driving in cold weather. Lifestyle If possible, avoid stressful and emotional situations. Try  to find ways to manage your stress, such as: Exercise. Yoga. Meditation. Biofeedback. Do not use any products that contain nicotine or tobacco. These products include cigarettes, chewing tobacco, and vaping devices, such as e-cigarettes. If you need help quitting, ask your health care provider. Avoid secondhand smoke. Limit your use of caffeine. Switch to decaffeinated coffee, tea, and soda. Avoid chocolate. Avoid vibrating tools and machinery. General instructions Protect your hands and feet from injuries, cuts, or bruises. Avoid wearing tight rings or wristbands. Wear loose fitting socks and comfortable, roomy shoes. Take over-the-counter and prescription medicines only as told by your health care provider. Where to find support Raynaud's Association: www.raynauds.org Where to find more information [employer] of Arthritis and Musculoskeletal and Skin Diseases: www.niams.SouthExposed.es Contact a health care provider if: Your discomfort becomes worse despite lifestyle changes. You develop sores on your fingers or toes that do not heal. You have breaks in the skin on your fingers or toes. You have a fever. You have pain or swelling in your joints. You have a rash. Your symptoms occur on only one side of your body. Get help right away if: Your fingers or toes turn black. You have severe pain in the affected areas. These symptoms may represent a serious problem that is an emergency. Do not wait to see if the symptoms will go away. Get medical help right away. Call your local emergency services (911 in the [place] Do not drive yourself to the hospital. Summary Raynaud's phenomenon is a condition that affects the arteries that carry blood to the fingers, toes, ears, lips, nipples, or the tip of the nose. In many cases, the cause of this condition is not known. Symptoms of this condition include changes in skin color along with numbness and tingling in the affected area. Treatment for  this condition includes lifestyle changes and reducing exposure to cold temperatures. Medicines may be used for severe cases of the condition. Contact your health care provider if your condition worsens despite treatment. This information is not intended to replace advice given to you by your health care provider. Make sure you discuss any questions you have with your health care provider. Document Revised: 06/29/2020 Document Reviewed: 06/29/2020 Elsevier Patient Education  [place] 1/4 cup of epsom salts in a quart of warm tap water.  Submerge your foot or feet in the solution and soak for 20 minutes.  This soak should be done twice a day.  Next, remove your foot or feet from solution, blot dry the affected area. Apply ointment and cover if instructed by your doctor.   IF YOUR SKIN BECOMES IRRITATED WHILE USING THESE INSTRUCTIONS, IT IS OKAY TO SWITCH TO  WHITE VINEGAR AND WATER.  As another alternative soak, you may use antibacterial soap and water.  Monitor for any signs/symptoms of infection. Call the office immediately if any occur or go directly to the emergency room. Call with any questions/concerns.

## 2022-07-27 NOTE — Progress Notes (Signed)
Subjective:   Patient ID: Natasha Hoover, female   DOB: 77 y.o.   MRN: [id number]   HPI Patient presents stating she is developed a lot of pain in her big toe right assuming she has an ingrown toenail and also has a lot of discoloration of all of her digits of both feet.  Patient does not smoke and is active   Review of Systems  All other systems reviewed and are negative.       Objective:  Physical Exam Vitals and nursing note reviewed.  Constitutional:      Appearance: She is well-developed.  Pulmonary:     Effort: Pulmonary effort is normal.  Musculoskeletal:        General: Normal range of motion.  Skin:    General: Skin is warm.  Neurological:     Mental Status: She is alert.     Circulatory status intact with normal PT pulses and slight diminishment of DP pulses with patient having red digits 1 through 5 both feet that she states occurred in the wintertime.  Patient states this year has been worse and she does have pain in the right hallux medial border localized with slight distal drainage and has reasonably good digital perfusion toes are cold.  Upon questioning no indications of proximal claudication and does have problems with her fingers also     Assessment:  Probability for Raynaud's disease with a paronychia of the right hallux nailbed     Plan:  H&P education concerning Raynaud's and the importance of avoiding cold exposure given.  No ulcerations but she is at high risk for this and I did educate her on that and today I infiltrated the right hallux 60 mg like Marcaine mixture I then using sterile instrumentation remove the medial border flushed it out and removed a small amount of necrotic tissue which I am hoping gives her relief but I do think the vast degree of discomfort and difficulty is due to the Raynaud's condition

## 2022-08-08 IMAGING — MG MM DIGITAL SCREENING BILAT W/ TOMO AND CAD
7 series · 9 of 19 positions shown · non-contrast
Comparison: Previous exam(s).

CLINICAL DATA: Screening.

EXAM:
DIGITAL SCREENING BILATERAL MAMMOGRAM WITH TOMOSYNTHESIS AND CAD
TECHNIQUE: Bilateral screening digital craniocaudal and mediolateral oblique
mammograms were obtained. Bilateral screening digital breast
tomosynthesis was performed. The images were evaluated with
computer-aided detection.

[L MLO synth-2D]
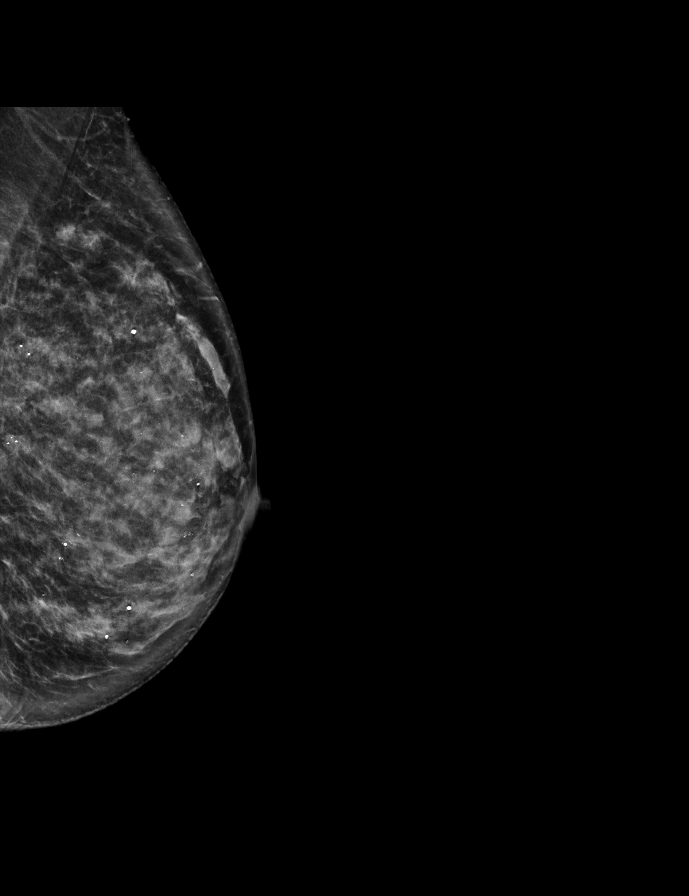

[R MLO synth-2D]
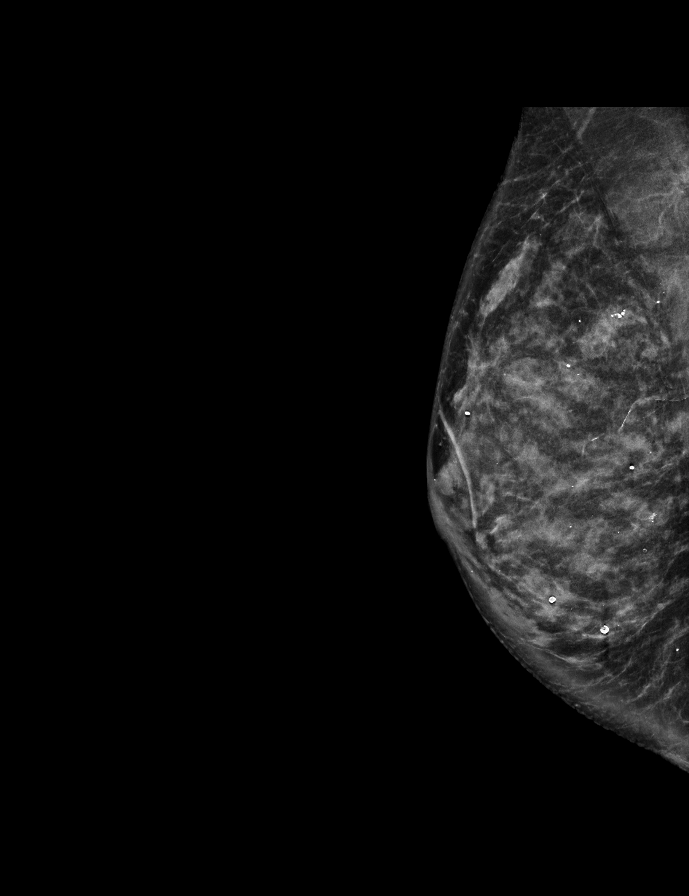

[L CC synth-2D]
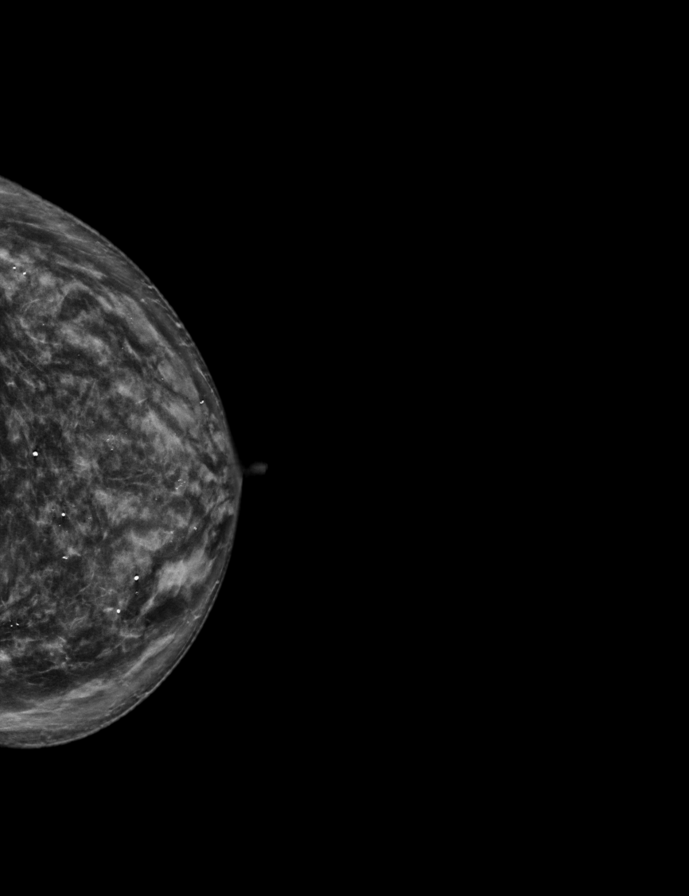

[R CC synth-2D]
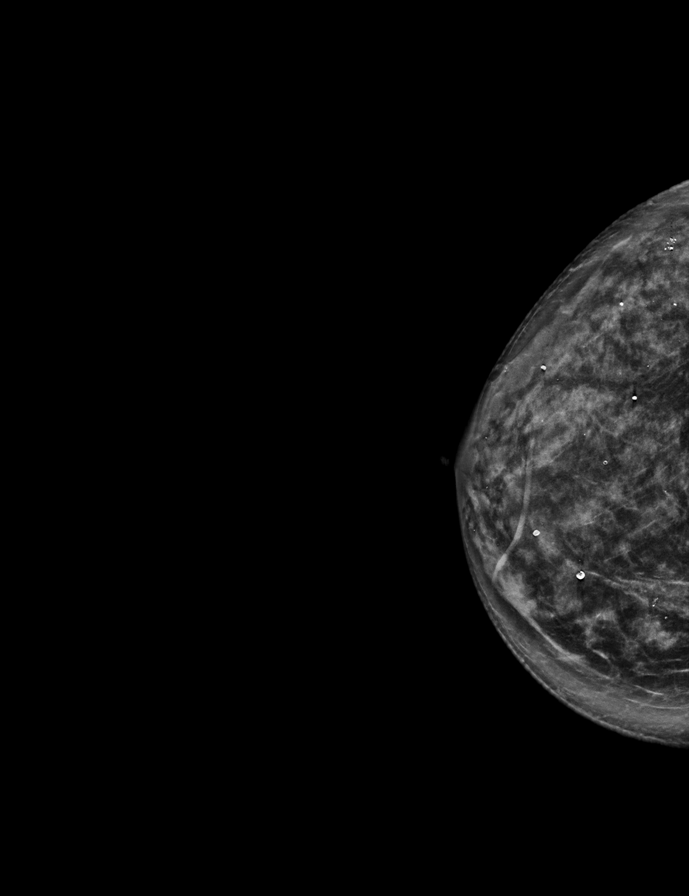

[L CC tomo · 3 of 65 frames shown]
[frame 21/65]
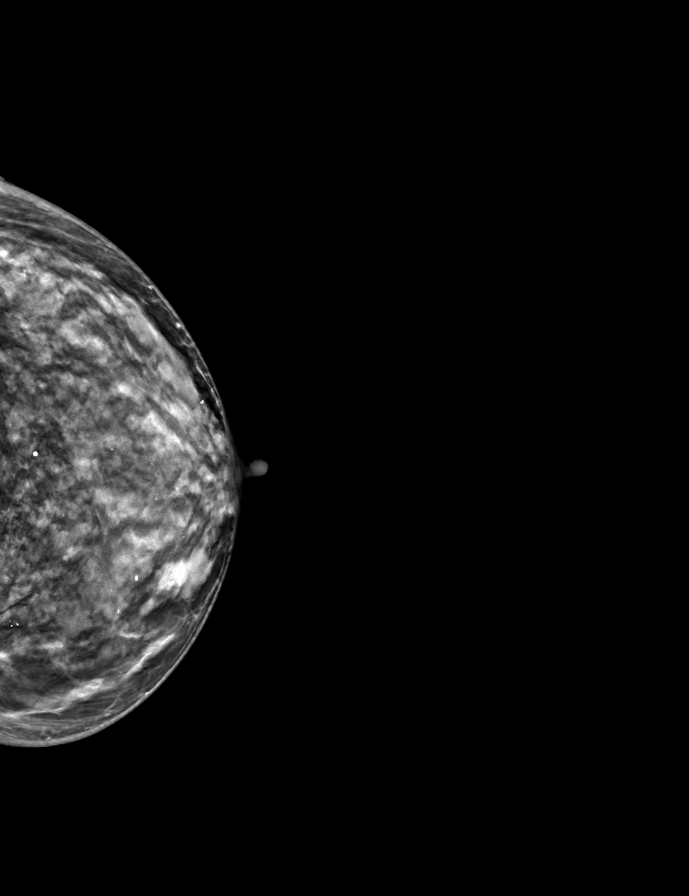
[frame 33/65]
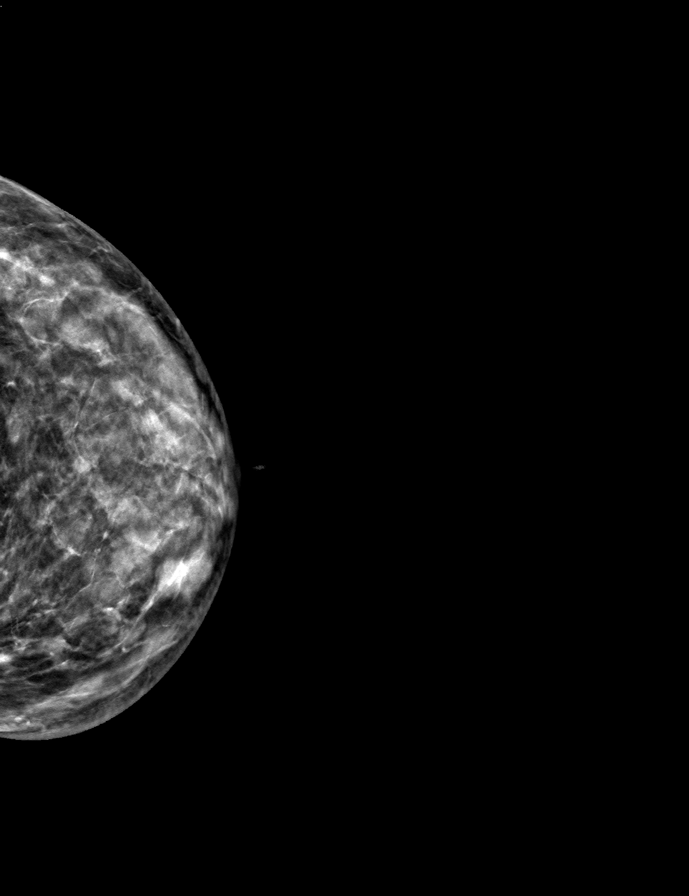
[frame 45/65]
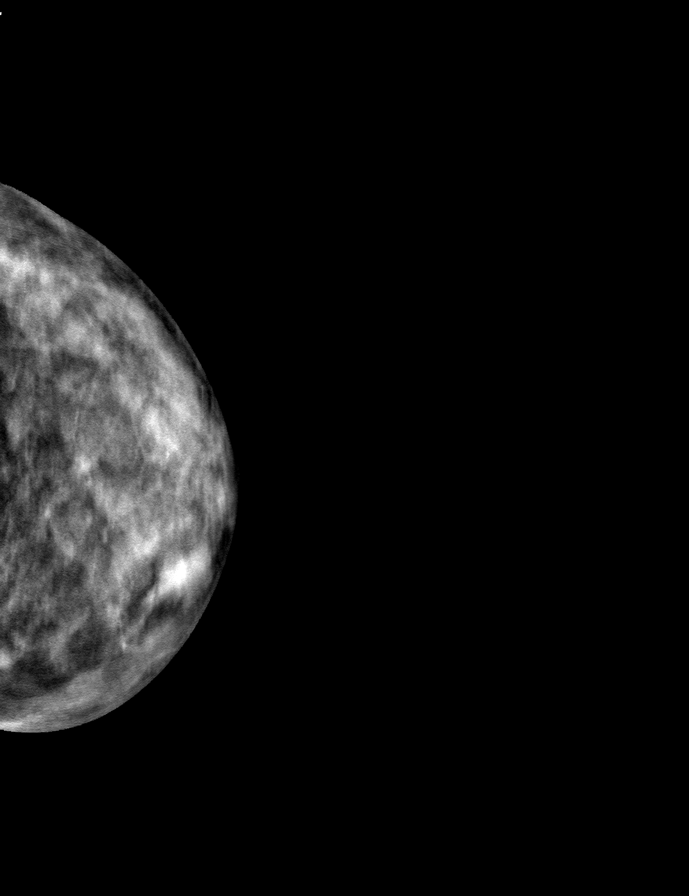

[L MLO tomo · tomo slice 31/61.0]
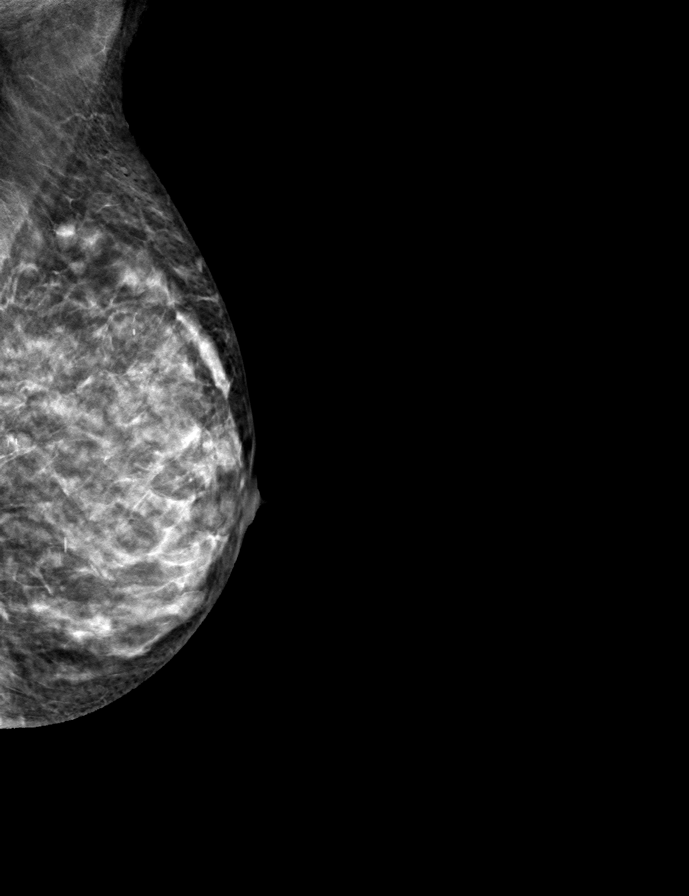

[R CC tomo · tomo slice 31/60.0]
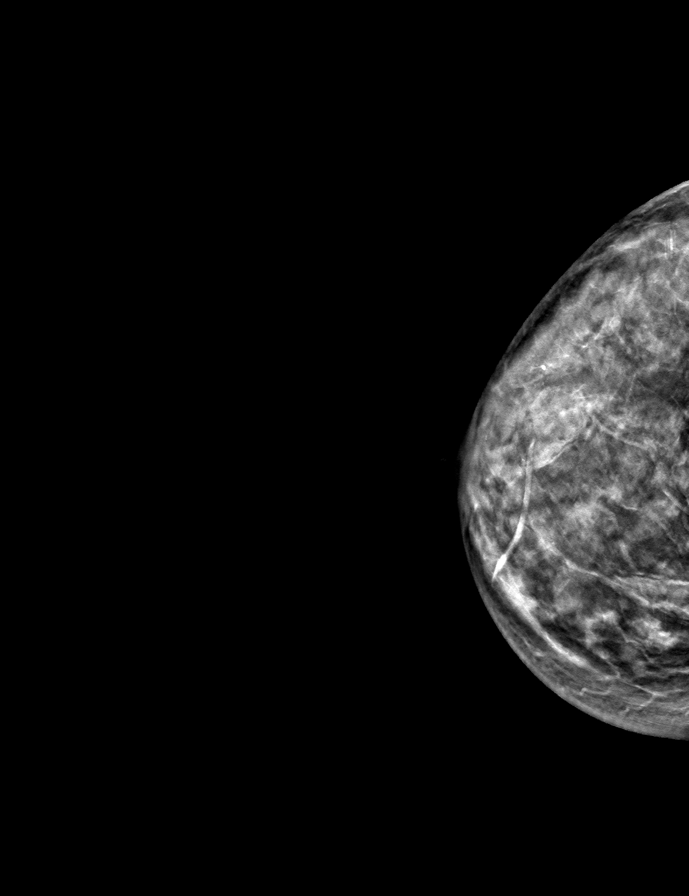

[9 of 19 positions shown; findings below may reference images not displayed]

ACR Breast Density Category d: The breast tissue is extremely dense,
which lowers the sensitivity of mammography
FINDINGS: There are no findings suspicious for malignancy.
IMPRESSION: No mammographic evidence of malignancy. A result letter of this
screening mammogram will be mailed directly to the patient.

RECOMMENDATION:
Screening mammogram in one year. (Code:[id number]-[id number])

BI-RADS CATEGORY  1: Negative.

## 2022-08-14 ENCOUNTER — Encounter: Payer: Self-pay | Admitting: Internal Medicine

## 2022-10-12 ENCOUNTER — Other Ambulatory Visit: Payer: Self-pay | Admitting: Internal Medicine

## 2022-12-13 DIAGNOSIS — M810 Age-related osteoporosis without current pathological fracture: Secondary | ICD-10-CM | POA: Diagnosis not present

## 2022-12-13 DIAGNOSIS — I10 Essential (primary) hypertension: Secondary | ICD-10-CM | POA: Diagnosis not present

## 2022-12-13 DIAGNOSIS — E782 Mixed hyperlipidemia: Secondary | ICD-10-CM | POA: Diagnosis not present

## 2022-12-13 DIAGNOSIS — R7303 Prediabetes: Secondary | ICD-10-CM | POA: Diagnosis not present

## 2023-04-11 DIAGNOSIS — H5202 Hypermetropia, left eye: Secondary | ICD-10-CM | POA: Diagnosis not present

## 2023-04-11 DIAGNOSIS — H04123 Dry eye syndrome of bilateral lacrimal glands: Secondary | ICD-10-CM | POA: Diagnosis not present

## 2023-04-11 DIAGNOSIS — H5211 Myopia, right eye: Secondary | ICD-10-CM | POA: Diagnosis not present

## 2023-04-11 DIAGNOSIS — H53143 Visual discomfort, bilateral: Secondary | ICD-10-CM | POA: Diagnosis not present

## 2023-04-11 DIAGNOSIS — Z961 Presence of intraocular lens: Secondary | ICD-10-CM | POA: Diagnosis not present

## 2023-04-11 DIAGNOSIS — H52223 Regular astigmatism, bilateral: Secondary | ICD-10-CM | POA: Diagnosis not present

## 2023-04-11 DIAGNOSIS — H524 Presbyopia: Secondary | ICD-10-CM | POA: Diagnosis not present

## 2023-05-01 DIAGNOSIS — H43391 Other vitreous opacities, right eye: Secondary | ICD-10-CM | POA: Diagnosis not present

## 2023-05-01 DIAGNOSIS — H43811 Vitreous degeneration, right eye: Secondary | ICD-10-CM | POA: Diagnosis not present

## 2023-05-01 DIAGNOSIS — H5211 Myopia, right eye: Secondary | ICD-10-CM | POA: Diagnosis not present

## 2023-05-01 DIAGNOSIS — H524 Presbyopia: Secondary | ICD-10-CM | POA: Diagnosis not present

## 2023-05-01 DIAGNOSIS — H5202 Hypermetropia, left eye: Secondary | ICD-10-CM | POA: Diagnosis not present

## 2023-05-01 DIAGNOSIS — H52223 Regular astigmatism, bilateral: Secondary | ICD-10-CM | POA: Diagnosis not present

## 2023-05-24 DIAGNOSIS — L570 Actinic keratosis: Secondary | ICD-10-CM | POA: Diagnosis not present

## 2023-06-13 ENCOUNTER — Encounter: Payer: Self-pay | Admitting: Internal Medicine

## 2023-06-13 ENCOUNTER — Ambulatory Visit: Payer: Medicare PPO | Attending: Internal Medicine | Admitting: Internal Medicine

## 2023-06-13 VITALS — BP 122/78 | HR 68 | Ht 64.0 in | Wt 126.6 lb

## 2023-06-13 DIAGNOSIS — R000 Tachycardia, unspecified: Secondary | ICD-10-CM | POA: Diagnosis not present

## 2023-06-13 DIAGNOSIS — Z136 Encounter for screening for cardiovascular disorders: Secondary | ICD-10-CM

## 2023-06-13 NOTE — Patient Instructions (Signed)
 Medication Instructions:  No changes  *If you need a refill on your cardiac medications before your next appointment, please call your pharmacy*   Lab Work: None  If you have labs (blood work) drawn today and your tests are completely normal, you will receive your results only by: MyChart Message (if you have MyChart) OR A paper copy in the mail If you have any lab test that is abnormal or we need to change your treatment, we will call you to review the results.   Testing/Procedures: None    Follow-Up: At [facility], you and your health needs are our priority.  As part of our continuing mission to provide you with exceptional heart care, we have created designated Provider Care Teams.  These Care Teams include your primary Cardiologist (physician) and Advanced Practice Providers (APPs -  Physician Assistants and Nurse Practitioners) who all work together to provide you with the care you need, when you need it.   Your next appointment:   1 year(s)  Provider:   With any APP  Other Instructions

## 2023-06-13 NOTE — Progress Notes (Signed)
 Cardiology Office Note:    Date:  06/13/2023   ID:  Natasha Hoover, DOB [birth year], MRN [id number]  PCP:  Aisha Harvey, MD   [facility] Providers Cardiologist:  Alvan Ronal BRAVO, MD     Referring MD: Aisha Harvey, MD   No chief complaint on file. Atrial tachycardia  History of Present Illness:    Natasha Hoover is a 78 y.o. female with a hx of HTN, referral for tachycardia from the ED   She presented to the ED 5/4 for palpitations and reported HR 140. She notes SOB with this. She denies infectious symptoms. She was not having any issues. She was started on metoprolol  and feels better . TSH is normal. She has no hx of cardiac disease history. Former light smoker, > 30 years ago. No stress test in the past. No CP or SOB. Her heart rates have been normal. Normal Bps  Interim Hx 8/9 She had minimal PAT.This was on metoprolol  12.5 mg BID. She feels more winded and notes lower heart rates after stopping the metoprolol . She notes palpitations at night. She denies chest pressure. She can get tightness for 1 second, like a tightening muscle.  Interim Hx 06/12/2022 Last visit she was a pre-op for colonoscopy. Noted that her HR was 140 bpm. This was after the prep.   Interim hx 06/13/2023 She has mild palpitations, but otherwise no cardiac symptoms.  Her blood pressure is well-controlled.  She is doing very well  Past Medical History:  Diagnosis Date   Hypertension     Past Surgical History:  Procedure Laterality Date   BREAST EXCISIONAL BIOPSY Right     Current Medications: Current Outpatient Medications on File Prior to Visit  Medication Sig Dispense Refill   ADVIL DUAL ACTION 125-250 MG TABS Take 2 tablets by mouth in the morning and at bedtime.     alendronate (FOSAMAX) 70 MG tablet Take 70 mg by mouth every Tuesday.     atorvastatin (LIPITOR) 10 MG tablet Take 10 mg by mouth at bedtime.     BIOTIN PO Take 1 tablet by mouth daily with breakfast.     lisinopril  (ZESTRIL ) 5  MG tablet Take 1 tablet (5 mg total) by mouth daily. 90 tablet 3   metoprolol  succinate (TOPROL -XL) 25 MG 24 hr tablet TAKE 1/2 TABLET(12.5 MG) BY MOUTH DAILY WITH OR IMMEDIATELY FOLLOWING A MEAL 90 tablet 3   Misc Natural Products (JOINT HEALTH) CAPS Take 1 capsule by mouth daily with breakfast.     Multiple Vitamins-Minerals (CENTRUM SILVER 50+WOMEN) TABS Take 1 tablet by mouth daily with breakfast.     NON FORMULARY See admin instructions. Vital Proteins Collagen Peptides Advanced - with Hyaluronic Acid & Vitamin C powder- Mix 2 tablespoonsful of powder into 2 cups of coffe and drink once a day     No current facility-administered medications on file prior to visit.     Allergies:   Patient has no known allergies.   Social History   Socioeconomic History   Marital status: Married    Spouse name: Not on file   Number of children: Not on file   Years of education: Not on file   Highest education level: Not on file  Occupational History   Not on file  Tobacco Use   Smoking status: Never   Smokeless tobacco: Never  Substance and Sexual Activity   Alcohol use: Not on file   Drug use: Not on file   Sexual activity: Not on file  Other Topics Concern   Not on file  Social History Narrative   Not on file   Social Drivers of Health   Financial Resource Strain: Not on file  Food Insecurity: Not on file  Transportation Needs: Not on file  Physical Activity: Not on file  Stress: Not on file  Social Connections: Not on file     Family History: The patient's family history includes Breast cancer in her maternal grandmother and paternal grandmother.  ROS:   Please see the history of present illness.    All other systems reviewed and are negative.  EKGs/Labs/Other Studies Reviewed:    The following studies were reviewed today:   EKG:  EKG is  ordered today.  The ekg ordered today demonstrates   Sinus bradycardia HR 58 bpm with PACs  06/12/2022- sinus bradycardia 59  bpm  Recent Labs: No results found for requested labs within last 365 days.   Recent Lipid Panel No results found for: CHOL, TRIG, HDL, CHOLHDL, VLDL, LDLCALC, LDLDIRECT   Risk Assessment/Calculations:           Physical Exam:    VS:    Vitals:   06/13/23 1321  BP: 122/78  Pulse: 68      Wt Readings from Last 3 Encounters:  06/13/23 126 lb 9.6 oz (57.4 kg)  06/12/22 124 lb (56.2 kg)  12/14/21 126 lb 12.8 oz (57.5 kg)     GEN:  Well nourished, well developed in no acute distress HEENT: Normal NECK: No JVD; No carotid bruits LYMPHATICS: No lymphadenopathy CARDIAC: RRR, no murmurs, rubs, gallops RESPIRATORY:  Clear to auscultation without rales, wheezing or rhonchi  ABDOMEN: Soft, non-tender, non-distended MUSCULOSKELETAL:  No edema; No deformity  SKIN: Warm and dry NEUROLOGIC:  Alert and oriented x 3 PSYCHIATRIC:  Normal affect   ASSESSMENT:    #PAT: run of AT in the ED captured on EKG 5/5 139bpm.. Low dose metoprolol  has helped her symptoms. Her ziopatch showed brief runs of AT, longest interval was 18 beats. This is benign. Otherwise, this is a benign arrhythmia that can be managed with medications. It is not imperative she continues BB. She noted activity intolerance with metoprolol , will reduce the dose for now. - continue metop XL 12.5 mg daily  #HTN: Well controlled.  Continue lisinopril  5 mg daily   PLAN:    In order of problems listed above:   No changes today Follow up in 12 months with an APP      Medication Adjustments/Labs and Tests Ordered: Current medicines are reviewed at length with the patient today.  Concerns regarding medicines are outlined above.  Orders Placed This Encounter  Procedures   EKG 12-Lead   No orders of the defined types were placed in this encounter.   There are no Patient Instructions on file for this visit.   Signed, Alvan Ronal BRAVO, MD  06/13/2023 1:42 PM    [facility]

## 2023-06-29 DIAGNOSIS — H43391 Other vitreous opacities, right eye: Secondary | ICD-10-CM | POA: Diagnosis not present

## 2023-06-29 DIAGNOSIS — H5202 Hypermetropia, left eye: Secondary | ICD-10-CM | POA: Diagnosis not present

## 2023-06-29 DIAGNOSIS — H5211 Myopia, right eye: Secondary | ICD-10-CM | POA: Diagnosis not present

## 2023-06-29 DIAGNOSIS — H52223 Regular astigmatism, bilateral: Secondary | ICD-10-CM | POA: Diagnosis not present

## 2023-06-29 DIAGNOSIS — H524 Presbyopia: Secondary | ICD-10-CM | POA: Diagnosis not present

## 2023-07-03 DIAGNOSIS — E782 Mixed hyperlipidemia: Secondary | ICD-10-CM | POA: Diagnosis not present

## 2023-07-03 DIAGNOSIS — K219 Gastro-esophageal reflux disease without esophagitis: Secondary | ICD-10-CM | POA: Diagnosis not present

## 2023-07-03 DIAGNOSIS — I10 Essential (primary) hypertension: Secondary | ICD-10-CM | POA: Diagnosis not present

## 2023-07-03 DIAGNOSIS — Z0000 Encounter for general adult medical examination without abnormal findings: Secondary | ICD-10-CM | POA: Diagnosis not present

## 2023-07-03 DIAGNOSIS — J309 Allergic rhinitis, unspecified: Secondary | ICD-10-CM | POA: Diagnosis not present

## 2023-07-03 DIAGNOSIS — G43909 Migraine, unspecified, not intractable, without status migrainosus: Secondary | ICD-10-CM | POA: Diagnosis not present

## 2023-07-03 DIAGNOSIS — E559 Vitamin D deficiency, unspecified: Secondary | ICD-10-CM | POA: Diagnosis not present

## 2023-07-03 DIAGNOSIS — M810 Age-related osteoporosis without current pathological fracture: Secondary | ICD-10-CM | POA: Diagnosis not present

## 2023-07-03 DIAGNOSIS — R7303 Prediabetes: Secondary | ICD-10-CM | POA: Diagnosis not present

## 2023-08-07 ENCOUNTER — Other Ambulatory Visit: Payer: Self-pay | Admitting: Family Medicine

## 2023-08-07 DIAGNOSIS — Z0000 Encounter for general adult medical examination without abnormal findings: Secondary | ICD-10-CM

## 2023-08-14 ENCOUNTER — Ambulatory Visit
Admission: RE | Admit: 2023-08-14 | Discharge: 2023-08-14 | Disposition: A | Discharge status: Home / Self Care | Source: Ambulatory Visit | Attending: Family Medicine | Admitting: Family Medicine

## 2023-08-14 DIAGNOSIS — Z0000 Encounter for general adult medical examination without abnormal findings: Secondary | ICD-10-CM

## 2023-08-14 DIAGNOSIS — Z1231 Encounter for screening mammogram for malignant neoplasm of breast: Secondary | ICD-10-CM | POA: Diagnosis not present

## 2023-09-07 DIAGNOSIS — H1031 Unspecified acute conjunctivitis, right eye: Secondary | ICD-10-CM | POA: Diagnosis not present

## 2023-10-19 ENCOUNTER — Other Ambulatory Visit: Payer: Self-pay

## 2023-10-19 MED ORDER — METOPROLOL SUCCINATE ER 25 MG PO TB24: 12.5000 mg | ORAL_TABLET | Freq: Every day | ORAL | 2 refills | Status: DC

## 2023-12-24 DIAGNOSIS — M810 Age-related osteoporosis without current pathological fracture: Secondary | ICD-10-CM | POA: Diagnosis not present

## 2023-12-24 DIAGNOSIS — R894 Abnormal immunological findings in specimens from other organs, systems and tissues: Secondary | ICD-10-CM | POA: Diagnosis not present

## 2023-12-24 DIAGNOSIS — E782 Mixed hyperlipidemia: Secondary | ICD-10-CM | POA: Diagnosis not present

## 2023-12-24 DIAGNOSIS — R7303 Prediabetes: Secondary | ICD-10-CM | POA: Diagnosis not present

## 2023-12-24 DIAGNOSIS — I10 Essential (primary) hypertension: Secondary | ICD-10-CM | POA: Diagnosis not present

## 2023-12-24 DIAGNOSIS — E559 Vitamin D deficiency, unspecified: Secondary | ICD-10-CM | POA: Diagnosis not present

## 2024-01-23 ENCOUNTER — Other Ambulatory Visit (HOSPITAL_BASED_OUTPATIENT_CLINIC_OR_DEPARTMENT_OTHER): Payer: Self-pay | Admitting: Family Medicine

## 2024-01-23 DIAGNOSIS — M810 Age-related osteoporosis without current pathological fracture: Secondary | ICD-10-CM

## 2024-01-29 DIAGNOSIS — L570 Actinic keratosis: Secondary | ICD-10-CM | POA: Diagnosis not present

## 2024-01-29 DIAGNOSIS — D2371 Other benign neoplasm of skin of right lower limb, including hip: Secondary | ICD-10-CM | POA: Diagnosis not present

## 2024-01-29 DIAGNOSIS — Z85828 Personal history of other malignant neoplasm of skin: Secondary | ICD-10-CM | POA: Diagnosis not present

## 2024-01-29 DIAGNOSIS — C44712 Basal cell carcinoma of skin of right lower limb, including hip: Secondary | ICD-10-CM | POA: Diagnosis not present

## 2024-01-29 DIAGNOSIS — L308 Other specified dermatitis: Secondary | ICD-10-CM | POA: Diagnosis not present

## 2024-02-07 DIAGNOSIS — C44712 Basal cell carcinoma of skin of right lower limb, including hip: Secondary | ICD-10-CM | POA: Diagnosis not present

## 2024-06-11 ENCOUNTER — Encounter: Payer: Self-pay | Admitting: Cardiology

## 2024-06-11 MED ORDER — METOPROLOL SUCCINATE ER 25 MG PO TB24: 25.0000 mg | ORAL_TABLET | Freq: Every day | ORAL | Status: AC

## 2024-06-11 NOTE — Telephone Encounter (Signed)
 Please increase her metoprolol  to 25 mg daily up to her appointment     Tobb, Kardie, DO 06/11/24 11:26 AM   Medication list update but not sent pharmacy for quantity or direction changes

## 2024-06-19 ENCOUNTER — Ambulatory Visit: Admitting: Cardiology

## 2024-09-03 ENCOUNTER — Other Ambulatory Visit (HOSPITAL_BASED_OUTPATIENT_CLINIC_OR_DEPARTMENT_OTHER)
